# Patient Record
Sex: Female | Born: 1965
Health system: Southern US, Community
[De-identification: ages and names within clinical notes are randomized; demographics above are authoritative.]

## PROBLEM LIST (undated history)

## (undated) DIAGNOSIS — B019 Varicella without complication: Secondary | ICD-10-CM

## (undated) DIAGNOSIS — Z0000 Encounter for general adult medical examination without abnormal findings: Secondary | ICD-10-CM

## (undated) DIAGNOSIS — J019 Acute sinusitis, unspecified: Secondary | ICD-10-CM

## (undated) DIAGNOSIS — R140 Abdominal distension (gaseous): Secondary | ICD-10-CM

## (undated) DIAGNOSIS — S40269A Insect bite (nonvenomous) of unspecified shoulder, initial encounter: Secondary | ICD-10-CM

## (undated) DIAGNOSIS — H8102 Meniere's disease, left ear: Secondary | ICD-10-CM

## (undated) DIAGNOSIS — F329 Major depressive disorder, single episode, unspecified: Secondary | ICD-10-CM

## (undated) DIAGNOSIS — W57XXXA Bitten or stung by nonvenomous insect and other nonvenomous arthropods, initial encounter: Secondary | ICD-10-CM

## (undated) DIAGNOSIS — T7840XA Allergy, unspecified, initial encounter: Secondary | ICD-10-CM

## (undated) DIAGNOSIS — H6590 Unspecified nonsuppurative otitis media, unspecified ear: Secondary | ICD-10-CM

## (undated) HISTORY — DX: Acute sinusitis, unspecified: J01.90

## (undated) HISTORY — DX: Bitten or stung by nonvenomous insect and other nonvenomous arthropods, initial encounter: W57.XXXA

## (undated) HISTORY — DX: Meniere's disease, left ear: H81.02

## (undated) HISTORY — DX: Varicella without complication: B01.9

## (undated) HISTORY — DX: Major depressive disorder, single episode, unspecified: F32.9

## (undated) HISTORY — DX: Encounter for general adult medical examination without abnormal findings: Z00.00

## (undated) HISTORY — DX: Unspecified nonsuppurative otitis media, unspecified ear: H65.90

## (undated) HISTORY — DX: Abdominal distension (gaseous): R14.0

## (undated) HISTORY — DX: Allergy, unspecified, initial encounter: T78.40XA

## (undated) HISTORY — DX: Insect bite (nonvenomous) of unspecified shoulder, initial encounter: S40.269A

---
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
## 2001-07-19 ENCOUNTER — Emergency Department (HOSPITAL_COMMUNITY): Admission: EM | Admit: 2001-07-19 | Discharge: 2001-07-19 | Payer: Self-pay | Admitting: Emergency Medicine

## 2001-07-19 ENCOUNTER — Encounter: Payer: Self-pay | Admitting: Emergency Medicine

## 2002-06-21 ENCOUNTER — Emergency Department (HOSPITAL_COMMUNITY): Admission: EM | Admit: 2002-06-21 | Discharge: 2002-06-22 | Payer: Self-pay | Admitting: Emergency Medicine

## 2003-06-07 ENCOUNTER — Other Ambulatory Visit: Admission: RE | Admit: 2003-06-07 | Discharge: 2003-06-07 | Payer: Self-pay | Admitting: Obstetrics and Gynecology

## 2003-10-20 DIAGNOSIS — F32A Depression, unspecified: Secondary | ICD-10-CM

## 2003-10-20 HISTORY — DX: Depression, unspecified: F32.A

## 2003-11-27 ENCOUNTER — Inpatient Hospital Stay (HOSPITAL_COMMUNITY): Admission: AD | Admit: 2003-11-27 | Discharge: 2003-11-27 | Payer: Self-pay | Admitting: Obstetrics and Gynecology

## 2003-12-15 ENCOUNTER — Inpatient Hospital Stay (HOSPITAL_COMMUNITY): Admission: AD | Admit: 2003-12-15 | Discharge: 2003-12-20 | Payer: Self-pay | Admitting: Obstetrics and Gynecology

## 2004-11-18 ENCOUNTER — Ambulatory Visit: Payer: Self-pay | Admitting: Family Medicine

## 2004-11-20 ENCOUNTER — Emergency Department (HOSPITAL_COMMUNITY): Admission: EM | Admit: 2004-11-20 | Discharge: 2004-11-21 | Payer: Self-pay | Admitting: Emergency Medicine

## 2006-08-15 ENCOUNTER — Inpatient Hospital Stay (HOSPITAL_COMMUNITY): Admission: AD | Admit: 2006-08-15 | Discharge: 2006-08-19 | Payer: Self-pay | Admitting: Obstetrics and Gynecology

## 2006-08-16 ENCOUNTER — Encounter (INDEPENDENT_AMBULATORY_CARE_PROVIDER_SITE_OTHER): Payer: Self-pay | Admitting: *Deleted

## 2006-08-24 ENCOUNTER — Ambulatory Visit: Admission: RE | Admit: 2006-08-24 | Discharge: 2006-08-24 | Payer: Self-pay | Admitting: Obstetrics and Gynecology

## 2007-12-20 ENCOUNTER — Encounter: Admission: RE | Admit: 2007-12-20 | Discharge: 2007-12-20 | Payer: Self-pay | Admitting: Family Medicine

## 2008-01-03 ENCOUNTER — Encounter: Admission: RE | Admit: 2008-01-03 | Discharge: 2008-01-03 | Payer: Self-pay | Admitting: Family Medicine

## 2009-03-13 ENCOUNTER — Encounter: Admission: RE | Admit: 2009-03-13 | Discharge: 2009-03-13 | Payer: Self-pay | Admitting: Obstetrics and Gynecology

## 2009-04-05 IMAGING — CR DG CHEST 2V
2 series · 2 of 2 positions shown · non-contrast
Comparison: Report of a portable chest, 07/19/01.

CLINICAL DATA: Cough and fever.
 CHEST - 2 VIEWS:

[view not recorded (1 of 2)]
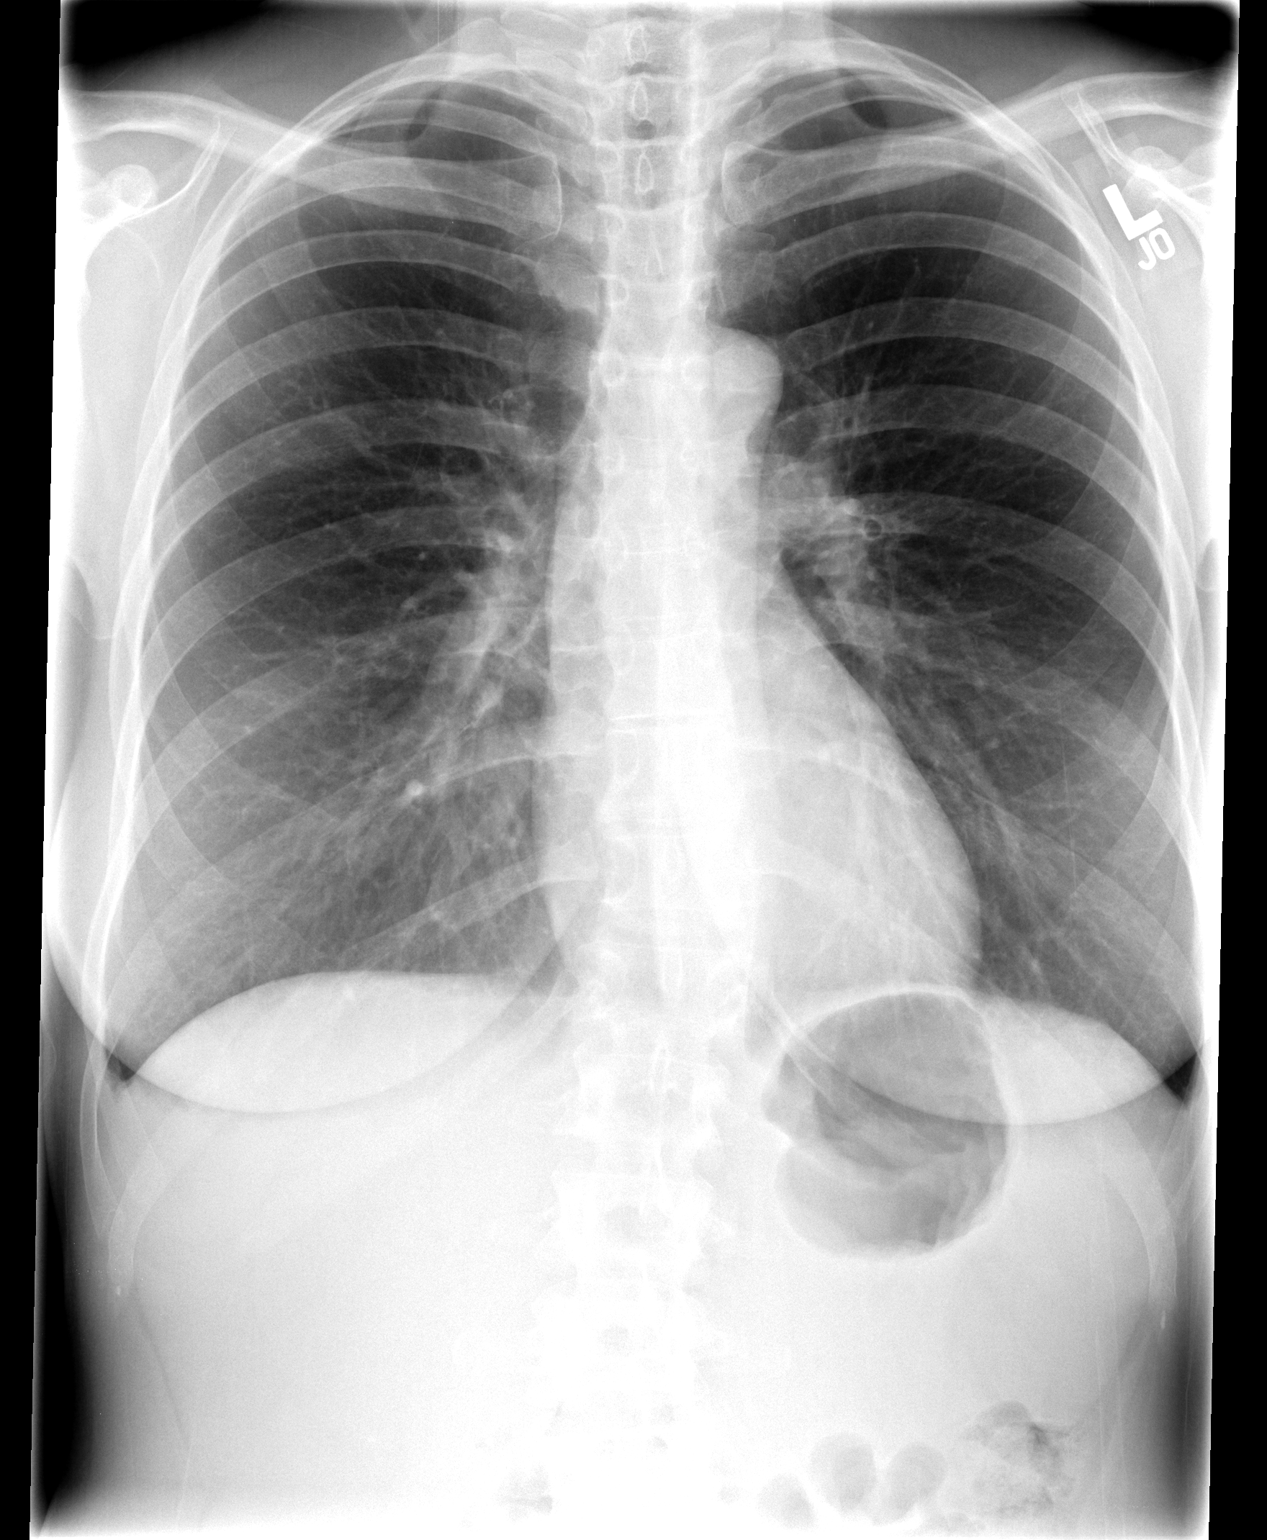

[view not recorded (2 of 2)]
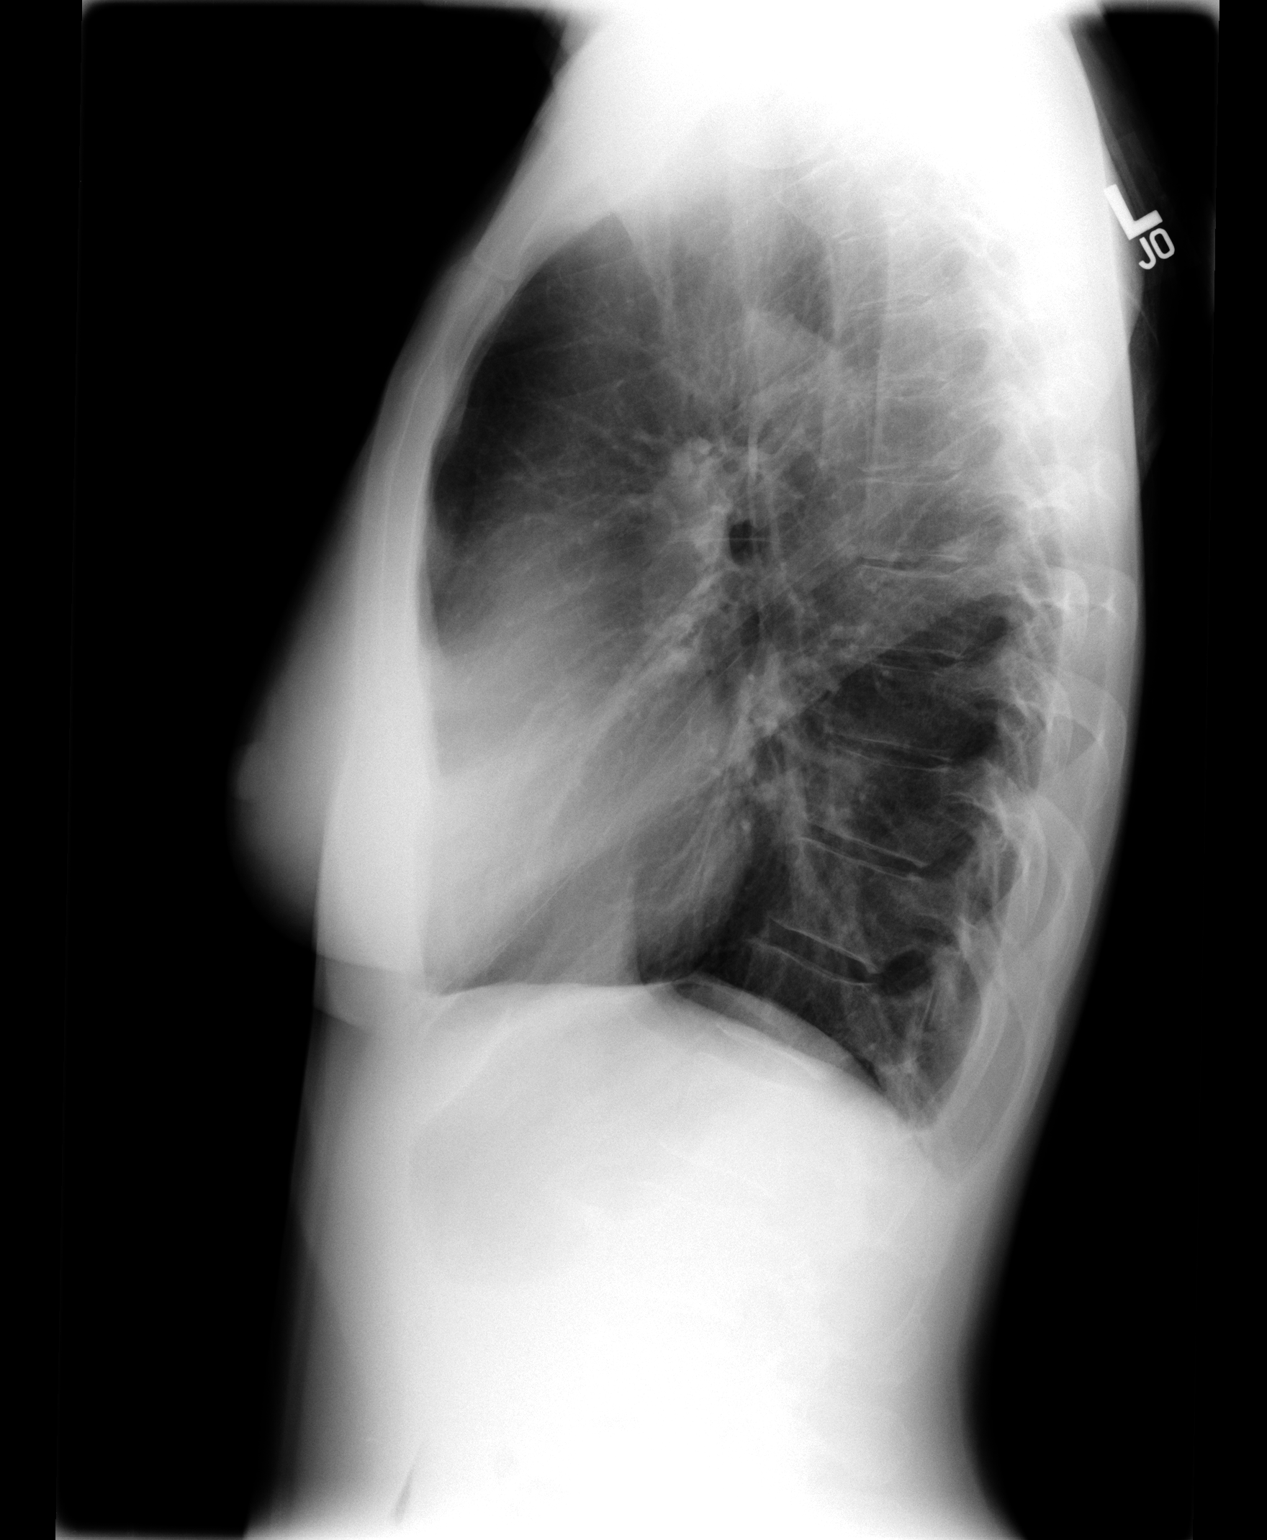

[2 of 2 positions shown; findings below may reference images not displayed]

FINDINGS: Heart and mediastinal contours are normal.  The lungs are hyperaerated.  There is peribronchial thickening without active airspace disease or pleural fluid.  Osseous structures intact.
IMPRESSION: Chronic changes ? no acute process.

## 2009-11-06 ENCOUNTER — Encounter (INDEPENDENT_AMBULATORY_CARE_PROVIDER_SITE_OTHER): Payer: Self-pay | Admitting: *Deleted

## 2009-11-11 ENCOUNTER — Telehealth: Payer: Self-pay | Admitting: Gastroenterology

## 2010-11-20 NOTE — Letter (Signed)
Summary: New Patient letter  [facility]  [address] [place], [place] [address]   Phone: [phone]  Fax: [phone]       11/06/2009 MRN: [id number]  CZARINA GINGRAS [address] [place], [place]  [address]  Dear Ms. Mordecai,  Welcome to the Gastroenterology Division at [facility].    You are scheduled to see Dr.  Christella Hartigan on 11-12-09 at 10:30AM on the 3rd floor at [facility], [address]  We ask that you try to arrive at our office 15 minutes prior to your appointment time to allow for check-in.  We would like you to complete the enclosed self-administered evaluation form prior to your visit and bring it with you on the day of your appointment.  We will review it with you.  Also, please bring a complete list of all your medications or, if you prefer, bring the medication bottles and we will list them.  Please bring your insurance card so that we may make a copy of it.  If your insurance requires a referral to see a specialist, please bring your referral form from your primary care physician.  Co-payments are due at the time of your visit and may be paid by cash, check or credit card.     Your office visit will consist of a consult with your physician (includes a physical exam), any laboratory testing he/she may order, scheduling of any necessary diagnostic testing (e.g. x-ray, ultrasound, CT-scan), and scheduling of a procedure (e.g. Endoscopy, Colonoscopy) if required.  Please allow enough time on your schedule to allow for any/all of these possibilities.    If you cannot keep your appointment, please call [phone] to cancel or reschedule prior to your appointment date.  This allows [place] the opportunity to schedule an appointment for another patient in need of care.  If you do not cancel or reschedule by 5 p.m. the business day prior to your appointment date, you will be charged a $50.00 late cancellation/no-show fee.    Thank you for choosing [facility] for your medical needs.  We appreciate the opportunity to care for you.  Please visit [place] at our website  to learn more about our practice.                     Sincerely,                                                             The Gastroenterology Division

## 2010-11-20 NOTE — Progress Notes (Signed)
Summary: appt necessary?  Phone Note Call from Patient Call back at Home Phone [phone]   Caller: Patient Call For: Dr. Christella Hartigan Reason for Call: Talk to Nurse Summary of Call: pt has appt tomorrow and wants someone to tell her whether it is still necessary that she comes... pt says her symptoms have subsidded and are almost completely resolved... told pt that this is her call, but she insisted on asking the CMA Initial call taken by: Vallarie Mare,  November 11, 2009 10:54 AM  Follow-up for Phone Call        I advised pt to call her GYN who referred her to [place] and let them know she feels better and if she should keep the appt with Dr Christella Hartigan.  Pt agreed and will call back by 5 pm today if she is going to cx the appt. Follow-up by: Chales Abrahams CMA Duncan Dull),  November 11, 2009 11:13 AM

## 2011-03-06 NOTE — Op Note (Signed)
NAME:  Nicole Lynch, Nicole Lynch NO.:  [id number]   MEDICAL RECORD NO.:  [id number]          PATIENT TYPE:  INP   LOCATION:  9130                          FACILITY:  [facility]   PHYSICIAN:  Malachi Pro. Ambrose Mantle, M.D. DATE OF BIRTH:  05-04-1966   DATE OF PROCEDURE:  08/16/2006  DATE OF DISCHARGE:                                 OPERATIVE REPORT   PREOPERATIVE DIAGNOSIS:  Intrauterine pregnancy at 40+ weeks, prior C-  section, face presentation.  Failure to progress in labor, deep variable  decelerations during Pitocin administration.   POSTOPERATIVE DIAGNOSIS:  Intrauterine pregnancy at 40+ weeks, prior C-  section, face presentation.  Failure to progress in labor, deep variable  decelerations during Pitocin administration.   OPERATION:  Low-transverse cervical C-section.   OPERATOR:  Malachi Pro. Ambrose Mantle, M.D.   ASSISTANT:  Sherron Monday, MD   ANESTHESIA:  Epidural anesthesia.   The patient was brought to the operating room.  Fetal heart tones were  confirmed to be normal.  She was placed in left lateral tilt position.  A  Foley catheter was already indwelling.  The abdomen was prepped with  Betadine solution and draped as a sterile field.  The old incision was  regular in its entirety except for the left lateral aspect the incision.  Incision dipped down inferiorly.  I asked the patient if she wanted to go  through the old scar or actually make it higher on the left and she chose to  go through the old incision.  I made the incision with the knife through the  old scar, carried in layers down through the subcutaneous tissue and fascia.  The fascia was then incised transversely, separated from the rectus muscles  superiorly and inferiorly and then the rectus muscle was already split in  the midline.  The peritoneum was opened vertically with the Mayo scissors.  Incision was enlarged.  A self-retaining flexible retractor was inserted.  The lower uterine segment was exposed.  I made  an incision into the lower  uterine segment myometrium and then went the rest of the way with my finger  encountered clear fluid.  I enlarged the incision by pulling superiorly and  inferiorly on the incision and then could tell that the baby was still in  the face presentation.  I initially tried to deliver the face through the  incisional opening but the baby did not want to come out so I converted to  an occiput anterior and then with fundal pressure the baby's head came out.  The nose and pharynx were suctioned with the bulb.  The remainder of the  baby was delivered.  The cord was clamped.  The infant was given to Dr.  Ruben Gottron who was in attendance.  He assigned the 8 pounds 11 ounces female  infant Apgars of 9 at one and 9 at five minutes.  A small amount of cord was  preserved in case pH was necessary.  I drew some blood about 5 mL for  routine cord blood studies and then removed the placenta and preserved it  for  the cord blood collection team.  There was some membrane still left in  the upper part of the uterus.  These were removed.  The inside of the uterus  was inspected, found to be free of any remaining debris.  Both tubes and  ovaries were normal as were as was the uterus.  The patient was asked again  about tubal ligation and she declined to proceed with tubal ligation.  I  closed the uterus then into layers using a running locked suture of 0 Vicryl  on the first layer, nonlocking suture of the same material on the second  layer. Hemostasis was adequate.  Liberal irrigation confirmed hemostasis.  The retractor was removed and then the rectus muscle was reapproximated in  the midline with interrupted 0 Vicryl sutures and peritoneum was included  with the rectus muscle superiorly.  The rectus fascia was then closed with  two  running sutures of 0 Vicryl.  The subcu with a running 3-0 Vicryl and the  skin was closed with automatic staples.  The patient seemed to tolerate  the  procedure well.  Blood loss was estimated at 1000 mL.  Sponge and needle  counts were correct and she was returned to recovery in satisfactory  condition.      Malachi Pro. Ambrose Mantle, M.D.  Electronically Signed     TFH/MEDQ  D:  08/16/2006  T:  08/17/2006  Job:  [id number]

## 2011-03-06 NOTE — Discharge Summary (Signed)
NAME:  HOLIDAY, MCMENAMIN NO.:  [id number]   MEDICAL RECORD NO.:  [id number]          PATIENT TYPE:  INP   LOCATION:  9130                          [facility]ACILITY:  [facility]   PHYSICIAN:  Malachi Pro. Ambrose Mantle, M.D. DATE OF BIRTH:  03-23-1966   DATE OF ADMISSION:  08/15/2006  DATE OF DISCHARGE:  08/19/2006                                 DISCHARGE SUMMARY   A 45 year old white female para 1-0-3-1, gravida 5 at 40-4/[redacted] weeks gestation  who presented to maternity admission with regular contractions.  Blood group  and type O+, negative antibody, RPR nonreactive, rubella immune, hepatitis B  surface antigen negative, GC and chlamydia negative.  One-hour Glucola 107.  Group B strep negative.  The patient had, at 6:00 a.m., spontaneous rupture  of membranes.  She was trying for vaginal birth after cesarean section and  received Pitocin to stimulate labor, but she began having deep variable  decelerations.  The Pitocin was discontinued.  The patient made very poor  progress was taken to the operating room and underwent a low transverse  cervical C-section by Dr. Ambrose Mantle for a face presentation, prior C-section  with failure to progress in labor.  Delivery was of a female infant, 8 pounds  11 ounces, Apgars of 9 at 1 and 9 at 5 minutes.  Blood loss 1000 mL.  Postpartum the patient did very well.  She passed flatus, urinating well,  tolerated a regular diet, ambulated well without difficulty and on the third  postop day her staples were removed.  Strips were applied and she was ready  for discharge.   LABORATORY DATA:  Hemoglobin 13, hematocrit 37.2, white count 10,900,  platelet count 186,000.  Follow-up hematocrit 32.  PIH panel was normal.  RPR nonreactive.   FINAL DIAGNOSES:  1. Intrauterine pregnancy at 40+ weeks.  2. Prior cesarean section.  3. Face presentation.  4. Failure to progress in labor.   OPERATION:  Low transverse cervical C-section.   FINAL CONDITION:  Improved.   Instructions include our regular discharge instruction booklet.  Percocet  5/325 24 tablets one every 4-6 hours as needed for pain.  The patient is  counseled to report any signs of postpartum depression and return to the  office in 10-14 days for follow-up examination.      Malachi Pro. Ambrose Mantle, M.D.  Electronically Signed     TFH/MEDQ  D:  08/19/2006  T:  08/19/2006  Job:  [id number]

## 2011-03-06 NOTE — H&P (Signed)
NAME:  Nicole Lynch, WARSHAWSKY NO.:  [id number]   MEDICAL RECORD NO.:  [id number]          PATIENT TYPE:  INP   LOCATION:  9174                          [facility]ILITY[facility]   PHYSICIAN:  Malachi Pro. Ambrose Mantle, M.D. DATE OF BIRTH:  [birth year]   DATE OF ADMISSION:  08/16/2006  DATE OF DISCHARGE:                                HISTORY & PHYSICAL   UNIT NUMBER:  As stated.   HISTORY OF PRESENT ILLNESS:  A 45 year old white female, para 1-0-3-1,  gravida 5, estimated gestational age 56-4/7 weeks by last period compatible  with a 9-week ultrasound with [facility] August 11, 2006.  She presented  complaining of regular contractions.  She was evaluated in Maternity  Admissions with regular contractions.  Blood group and type O positive,  negative antibody, RPR nonreactive, rubella immune, hepatitis B surface  antigen negative, GC/Chlamydia negative, one hour Glucola 107, Group B strep  negative.   PRENATAL CARE:  Advanced maternal age with normal first trimester screening  and normal ultrasound, previous low transverse cervical cesarean section  with trial of labor and desires VBAC.  The patient is on Valtrex for HSV  suppression.   OBSTETRIC HISTORY:  She did have an early abortion x2.  Spontaneous abortion  x1.  In 2005, she had a low transverse cervical cesarean section for  arrested dilatation with delivery of a 7 pound 6 ounce infant.   GYNECOLOGY HISTORY:  Herpes and Cryotherapy.   PAST MEDICAL HISTORY:  1. Postpartum depression.  2. Migraines.   PAST SURGICAL HISTORY:  1. Rhinoplasty.  2. C-section.   ALLERGIES:  No known drug allergies.   MEDICATIONS:  Valtrex 500 mg daily.   PHYSICAL EXAMINATION:  VITAL SIGNS:  Blood pressure at present is 152/85,  pulse 110, respirations 16, temperature 97.9.  HEART:  Normal size and sounds, slight tachycardia, no murmurs.  LUNGS:  Clear to auscultation.  ABDOMEN:  Soft, nontender.  Dr. Jackelyn Knife estimated the fetal weight of 7-  1/2  pounds.  Cervix is 5 cm, 90%.  The vertex has been noted to be an  effaced presentation since about 7 a.m.   HOSPITAL COURSE:  The patient requested minimal intervention.  So, Pitocin  was not begun.  At 4:35 a.m., the cervix was 3-4 cm, 90%, vertex at a -2 to  -3.  The vertex was felt to be slightly to the right by Dr. Jackelyn Knife.  At  7:20 a.m., the cervix was 5 cm by Dr. Jackelyn Knife, 90%.  Vertex was at a -1,  but some of the forehead was presenting.  He could feel the nose at 1  o'clock.  Spontaneous rupture of membranes had occurred at 6:10 a.m.  At  9:13 a.m., the contractions were fairly regular.  Fetal heart tones were  normal.  Cervix was 4 cm by my exam.  I talked to the patient about Pitocin,  and she decided that she would consider that as an intervention.  Pitocin  was begun, with the patient's consent, but the baby had variable  decelerations.  Some were quite deep, and she elected to stop  the Pitocin.  At 12:15 p.m., the cervix was 5 cm, 90%.  I could feel the nose and eyes at  a -2 station.  The patient wanted to proceed with C-section because of very  poor progress.  Probably no more than 1 cm of progress in 5 hours, deep  decelerations with the use of Pitocin and an abnormal presentation.   IMPRESSION:  Intrauterine pregnancy 40 plus weeks, prior C-section; deep  variable decelerations intermittently; failure to progress in labor.  The  patient is prepared for repeat C-section, and she is also going to decide  unequivocally whether I should tie her tubes at the time of the C-section.      Malachi Pro. Ambrose Mantle, M.D.  Electronically Signed     TFH/MEDQ  D:  08/16/2006  T:  08/16/2006  Job:  [id number]

## 2011-03-06 NOTE — Discharge Summary (Signed)
NAME:  Nicole Lynch, Nicole Lynch NO.:  [id number]   MEDICAL RECORD NO.:  [id number]                   PATIENT TYPE:  INP   LOCATION:  9103                                 [facility]ACILITY:  [facility]   PHYSICIAN:  Zenaida Niece, M.D.             DATE OF BIRTH:  [birth year]   DATE OF ADMISSION:  12/15/2003  DATE OF DISCHARGE:  12/20/2003                                 DISCHARGE SUMMARY   ADMISSION DIAGNOSES:  1. Intrauterine pregnancy at 39 weeks.  2. Gestational hypertension.   DISCHARGE DIAGNOSES:  1. Intrauterine pregnancy at 39 weeks.  2. Gestational hypertension.  3. Arrest of dilation.   PROCEDURE:  On December 16, 2003, primary low transverse Cesarean section.   HISTORY AND PHYSICAL:  This is a 45 year old white female, gravida 4, para 0-  0-3-0, with an EGA of 39+ weeks who presented with a complaint of decreased  fetal movement and contractions without bleeding or rupture of membranes.  Evaluation in triage revealed a reactive nonstress test, but elevated blood  pressures. Labs revealed low normal platelets, slightly elevated uric acid  and creatinine, and her vaginal exam was 1, 60, -2, and she was thus  admitted for induction. Prenatal care was complicated by advanced maternal  age with normal first trimester screening, normal AFP, and normal  ultrasound. 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 She had a breach presentation with a  successful version on February 8th.   PRENATAL LABORATORY:  Blood type is O positive with a negative antibody  screen, RPR nonreactive, Rubella immune. Hepatitis B surface antigen  negative. HIV negative. Gonorrhea and Chlamydia negative. One-hour Glucola  was 79.  Group B strep was negative.   PAST OBSTETRIC HISTORY:  Two elective terminations and one blighted ovum.   GYNECOLOGIC HISTORY:  Herpes simplex virus and history of cryotherapy.   PAST MEDICAL HISTORY:  History  of hypertension and migraines.   PAST SURGICAL HISTORY:  Rhinoplasty.   CURRENT MEDICATIONS:  Valtrex 500 mg a day.   PHYSICAL EXAMINATION:  She was afebrile with stable vital signs, except for  blood pressure of 140 to 160 over 80 to 90.  Fetal heart tracing was  reactive with occasional contractions. Abdomen gravid, nontender with an  estimated fetal weight of 7.5 pounds. Vaginal exam per the nurse was, 1, 60,  -2, vertex, and no herpetic lesions on my exam.   HOSPITAL COURSE:  The patient was admitted and started on Pitocin induction.  On my first exam she was 1-2, 70, -1, and amniotomy revealed clear fluid.  She progressed to 5 to 6 cm, but stayed that way for at least four hours  with adequate contractions. Options were discussed and we elected to proceed  with Cesarean section. On the morning of December 16, 2003, she had a  primary low transverse Cesarean section under spinal anesthesia. Estimated  blood loss was 800 cc.  She had normal anatomy and delivered a viable female  infant with Apgars of 9 and 9, weight of 7 pounds 6 ounces, in the ROP  position. Postoperatively she did fairly well. Pre-delivery hemoglobin was  12, post-delivery was 10.6.  She did have some labile blood pressures up to  170/100, but mostly in the 130s to 140s over 90s.  On the morning of  postoperative day #4, she was felt to be stable enough for discharge home.  Her baby did have jaundice and was on the Bili-blanket. Prior to discharge  her Prolene subcuticular suture was removed and her incision was healing  well.   DISCHARGE INSTRUCTIONS:  Regular diet, pelvic rest, no strenuous activity.  Follow up is in two weeks for an incision check. Medications are Percocet,  #20, one to two p.o. q.4-6h. p.r.n. pain and over-the-counter ibuprofen as  needed.                                               Zenaida Niece, M.D.    TDM/MEDQ  D:  12/20/2003  T:  12/20/2003  Job:  [id number]

## 2011-03-06 NOTE — Op Note (Signed)
NAME:  Nicole Lynch, HIRT NO.:  [id number]   MEDICAL RECORD NO.:  [id number]                   PATIENT TYPE:  INP   LOCATION:  9103                                 [facility]   PHYSICIAN:  Zenaida Niece, M.D.             DATE OF BIRTH:  Mar 10, 1966   DATE OF PROCEDURE:  12/16/2003  DATE OF DISCHARGE:                                 OPERATIVE REPORT   PREOPERATIVE DIAGNOSES:  1. Intrauterine pregnancy at 39 weeks.  2. Pregnancy-induced hypertension.  3. Arrest of dilation.   POSTOPERATIVE DIAGNOSES:  1. Intrauterine pregnancy at 39 weeks.  2. Pregnancy-induced hypertension.  3. Arrest of dilation.   PROCEDURE:  Primary low transverse cesarean section without extension.   SURGEON:  Zenaida Niece, M.D.   ANESTHESIA:  Spinal.   ESTIMATED BLOOD LOSS:  800 mL.   FINDINGS:  The patient had normal anatomy and delivered a viable female infant  with Apgars of 8 and 9 that weighed 7 pounds 6 ounces and was in the ROP  position.   PROCEDURE IN DETAIL:  The patient was taken to the operating room and placed  in a sitting position.  Dr. Kyung Rudd. Oddono instilled spinal anesthesia and  she was placed in the dorsal supine position with a left lateral tilt.  Her  abdomen was prepped and draped in the usual sterile fashion and a Foley  catheter inserted.  The level of her anesthesia was found to be adequate and  her abdomen was entered via standard Pfannenstiel incision.  A 4-cm  transverse incision was made in the lower uterine segment and the bladder  was pushed inferior.  Uterine incision was extended bilaterally digitally.  Fetal vertex was grasped and delivered through the incision from an ROP  position.  Mouth and nares were suctioned.  The remainder of the infant  delivered atraumatically.  The cord was doubly clamped and cut and the  infant handed to the awaiting pediatric team.  Cord bloods and a segment of  cord for gas were obtained and the  placenta delivered spontaneously.  Uterus  was cleaned with a dry lap pad and all clots and debris removed.  Uterine  incision was inspected and found to be free of extensions.  Uterine incision  was closed in 1 layer, being a running-locking layer with #1 chromic, with  adequate hemostasis.  Bleeding from the serosal edges was controlled with  electrocautery.  Both tubes and ovaries were inspected and found to be  normal.  Uterine incision was again inspected and found to be hemostatic.  Subfascial space was irrigated and made hemostatic with electrocautery.  Fascia was then closed in a running fashion, starting at both ends and  meeting in the middle with 0 Vicryl.  Subcutaneous tissue was irrigated and  made hemostatic with electrocautery.  Skin incision was closed with a  running subcuticular suture of 4-0 Prolene  followed by Steri-Strips and a  sterile bandage.  The patient tolerated the procedure well and was taken to  the recovery room in stable condition.  Counts were correct and she was  given Ancef 1 mg after cord clamp.                                               Zenaida Niece, M.D.    TDM/MEDQ  D:  12/16/2003  T:  12/16/2003  Job:  [id number]

## 2011-06-26 ENCOUNTER — Encounter: Payer: Self-pay | Admitting: Family Medicine

## 2011-06-26 ENCOUNTER — Ambulatory Visit (INDEPENDENT_AMBULATORY_CARE_PROVIDER_SITE_OTHER): Payer: BC Managed Care – PPO | Admitting: Family Medicine

## 2011-06-26 DIAGNOSIS — T7840XA Allergy, unspecified, initial encounter: Secondary | ICD-10-CM

## 2011-06-26 DIAGNOSIS — C4490 Unspecified malignant neoplasm of skin, unspecified: Secondary | ICD-10-CM

## 2011-06-26 DIAGNOSIS — J329 Chronic sinusitis, unspecified: Secondary | ICD-10-CM

## 2011-06-26 DIAGNOSIS — F3289 Other specified depressive episodes: Secondary | ICD-10-CM

## 2011-06-26 DIAGNOSIS — F329 Major depressive disorder, single episode, unspecified: Secondary | ICD-10-CM

## 2011-06-26 DIAGNOSIS — G43909 Migraine, unspecified, not intractable, without status migrainosus: Secondary | ICD-10-CM

## 2011-06-26 DIAGNOSIS — J019 Acute sinusitis, unspecified: Secondary | ICD-10-CM

## 2011-06-26 MED ORDER — GUAIFENESIN ER 600 MG PO TB12: ORAL_TABLET | ORAL | Status: DC

## 2011-06-26 MED ORDER — NAPROXEN 500 MG PO TABS: 500.0000 mg | ORAL_TABLET | Freq: Two times a day (BID) | ORAL | Status: DC | PRN

## 2011-06-26 MED ORDER — AMOXICILLIN-POT CLAVULANATE 875-125 MG PO TABS: 1.0000 | ORAL_TABLET | Freq: Two times a day (BID) | ORAL | Status: AC

## 2011-06-26 MED ORDER — RIZATRIPTAN BENZOATE 10 MG PO TBDP: 10.0000 mg | ORAL_TABLET | ORAL | Status: DC | PRN

## 2011-06-26 MED ORDER — PROMETHAZINE HCL 25 MG PO TABS: 25.0000 mg | ORAL_TABLET | Freq: Three times a day (TID) | ORAL | Status: DC | PRN

## 2011-06-26 NOTE — Patient Instructions (Signed)
Sinusitis Sinuses are air pockets within the bones of your face. The growth of bacteria within a sinus leads to infection. Infection keeps the sinuses from draining. This infection is called sinusitis. SYMPTOMS There will be different areas of pain depending on which sinuses have become infected.  The maxillary sinuses often produce pain beneath the eyes.   Frontal sinusitis may cause pain in the middle of the forehead and above the eyes.  Other problems (symptoms) include:  Toothaches.   Colored, pus-like (purulent) drainage from the nose.   Any swelling, warmth, or tenderness over the sinus areas may be signs of infection.  TREATMENT Sinusitis is most often determined by an exam and you may have x-rays taken. If x-rays have been taken, make sure you obtain your results. Or find out how you are to obtain them. Your caregiver may give you medications (antibiotics). These are medications that will help kill the infection. You may also be given a medication (decongestant) that helps to reduce sinus swelling.  HOME CARE INSTRUCTIONS  Only take over-the-counter or prescription medicines for pain, discomfort, or fever as directed by your caregiver.   Drink extra fluids. Fluids help thin the mucus so your sinuses can drain more easily.   Applying either moist heat or ice packs to the sinus areas may help relieve discomfort.   Use saline nasal sprays to help moisten your sinuses. The sprays can be found at your local drugstore.  SEEK IMMEDIATE MEDICAL CARE IF YOU DEVELOP:  High fever that is still present after two days of antibiotic treatment.   Increasing pain, severe headaches, or toothache.   Nausea, vomiting, or drowsiness.   Unusual swelling around the face or trouble seeing.  MAKE SURE YOU:   Understand these instructions.   Will watch your condition.   Will get help right away if you are not doing well or get worse.  Document Released: 10/05/2005 Document Re-Released:  09/17/2008 [facility] Patient Information 2011 [place], [place].Migraine Headache A migraine headache is an intense, throbbing pain on one or both sides of your head. The exact cause of a migraine headache is not always known. A migraine may be caused when nerves in the brain become irritated and release chemicals that cause swelling (inflammation) within blood vessels, causing pain. Many migraine sufferers have a family history of migraines. Before you get a migraine you may or may not get an aura. An aura is a group of symptoms that can predict the beginning of a migraine. An aura may include:  Visual changes such as:   Flashing lights.   Seeing bright spots or zig-zag lines.   Tunnel vision.   Feelings of numbness.   Trouble talking.   Muscle weakness.  SYMPTOMS OF A MIGRAINE  A migraine headache has one or more of the following symptoms:  Pain on one or both sides of your head.   Pain that is pulsating or throbbing in nature.   Pain that is severe enough to prevent daily activities.   Pain that is aggravated by any daily physical activity.   Nausea (feeling sick to your stomach), vomiting or both.   Pain with exposure to bright lights, loud noises or activity.   General sensitivity to bright lights or loud noises.  MIGRAINE TRIGGERS A migraine headache can be triggered by many things. Examples of triggers include:   Alcohol.   Smoking.   Stress.   It may be related to menses (female menstruation).   Aged cheeses.   Foods or drinks  that contain nitrates, glutamate, aspartame or tyramine.   Lack of sleep.   Chocolate.   Caffeine.   Hunger.   Medications such as nitroglycerine (used to treat chest pain), birth control pills, estrogen and some blood pressure medications.  DIAGNOSIS  A migraine headache is often diagnosed based on:   Your symptoms.   Physical examination.   A CT scan of your head may be ordered to see if your headaches are caused from  other medical conditions.  HOME CARE INSTRUCTIONS  Medications can help prevent migraines if they are recurrent or should they become recurrent. Your caregiver can help you with a medication or treatment program that will be helpful to you.   If you get a migraine, it may be helpful to lie down in a dark, quiet room.   It may be helpful to keep a headache diary. This may help you find a trend as to what may be triggering your headaches.  SEEK IMMEDIATE MEDICAL CARE IF:   You do not get relief from the medications given to you or you have a recurrence of pain.   You have confusion, personality changes or seizures.   You have headaches that wake you from sleep.   You have an increased frequency in your headaches.   You have a stiff neck.   You have a loss of vision.   You have muscle weakness.   You start losing your balance or have trouble walking.   You feel faint or pass out.  MAKE SURE YOU:   Understand these instructions.   Will watch your condition.   Will get help right away if you are not doing well or get worse.  Document Released: 10/05/2005 Document Re-Released: 08/02/2009 [facility] Patient Information 2011 [place], [place].   Remember Ibuprofen=Advil=Motrin, is like Naproxen=Aleve, is like Aspirin  And all are NSAIDs (Nonsteroidal antiinflammatories  If you have taken any of these the only other OTC pain med to use that day is Tylenol/Acetaminophen  For HA sinus vs tension vs migraine start Naproxen and can take Maxalt quickly if it becomes a migraine they work well together

## 2011-06-29 ENCOUNTER — Encounter: Payer: Self-pay | Admitting: Family Medicine

## 2011-06-29 DIAGNOSIS — F329 Major depressive disorder, single episode, unspecified: Secondary | ICD-10-CM | POA: Insufficient documentation

## 2011-06-29 DIAGNOSIS — C4490 Unspecified malignant neoplasm of skin, unspecified: Secondary | ICD-10-CM | POA: Insufficient documentation

## 2011-06-29 DIAGNOSIS — F418 Other specified anxiety disorders: Secondary | ICD-10-CM | POA: Insufficient documentation

## 2011-06-29 DIAGNOSIS — G43909 Migraine, unspecified, not intractable, without status migrainosus: Secondary | ICD-10-CM | POA: Insufficient documentation

## 2011-06-29 DIAGNOSIS — J019 Acute sinusitis, unspecified: Secondary | ICD-10-CM

## 2011-06-29 DIAGNOSIS — T7840XA Allergy, unspecified, initial encounter: Secondary | ICD-10-CM | POA: Insufficient documentation

## 2011-06-29 DIAGNOSIS — B019 Varicella without complication: Secondary | ICD-10-CM | POA: Insufficient documentation

## 2011-06-29 HISTORY — DX: Acute sinusitis, unspecified: J01.90

## 2011-06-29 NOTE — Assessment & Plan Note (Addendum)
Patient with a long history of intermittent migraines dating back to early 73s or late 1980s. Previously seen at [facility] headache Center and they were able to sort out some of her triggers such as dust and smoke. She has since been able to manage them and they have been very infrequent. She does note exercise is helpful and over-the-counter medications have been adequate. Over the past 3 days she's had a persistent headache that is of concern to her so she is here for evaluation. She has used Imitrex in the past with good results. She does note in August 2011 she was hit in her left parietal region by a softball she was nauseous for a week cracked tooth and has been having trouble with sleep since that time. She's had a lot of facial pressure and some left eye pressure is well. This past week she had a headache several days ago took some Tylenol helped temporarily but then over the last couple days developed increased nasal congestion. Over the last 3 days worsening headache malaise nausea. She often has a droop in her eyelid on the left when her headaches get bad and that has occurred over the last few days. She is given an rx for Naproxen, Maxalt MLt and Phenergan to use as directed to manage her HA, notify us if no improvement. She agrees to follow up after fasting labs are done

## 2011-06-29 NOTE — Progress Notes (Signed)
Nicole Lynch [id number] [id number]/29/2011      Progress Note New Patient  Subjective  Chief Complaint  Chief Complaint  Patient presents with  . Establish Care    new patient    HPI  Patient is a 45 year old Caucasian female who is in today for urgent new patient appointment. She is a long history of migraine headaches dating back over 20 years. There was a time when they were frequent and happen multiple times in a week. Back in early 90s she went to a [facility] headache Center and had some triggers such as dust and smoke identified. With avoidance of these triggers, regular exercise and to myself changes she is very infrequent headaches. She notes while in the last 7 years she said maybe to really bad headaches. Her headaches get back she has a droop noted in her left eye she has a heavy painful feeling in the left side of her face. Over the last year she's had some increase in nasal congestion and pressure over her frontal sinuses as well. Her headaches get really bad she has nausea and vomiting and notes at present she does have nausea. She has not felt well for the last 3 days. She has tried Tylenol and got partial relief only to have the symptoms return. She took some Allerest which she feels estimated nasal congestion worse. She has tried some nasal saline that has helped temporarily. Last night she took some Tylenol and got some sleep awoke still with headache this morning. She notes last year she took softball to her left parietal region while playing again. She reports having nausea for a week. Denies syncope. He cracked a tooth and ultimately had to have old. She's had more trouble sleeping since that time secondary to some tenderness when she lies on that region. She continues to see Dr. Huel Cote of GYN for her Paps and GYN care. She stopped her Dr Loma Boston of dermatology and believes she has been diagnosed with skin cancer in the past but is unsure which type.  Past Medical  History  Diagnosis Date  . Chicken pox as a child  . Migraine   . Allergy     seasonal  . 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 Sinusitis acute 06/29/2011    Past Surgical History  Procedure Date  . Cesarean section 2-05, 10-07    X 2  . Deviated septum repair 1982    No family history on file.  History   Social History  . Marital Status: Married    Spouse Name: N/A    Number of Children: N/A  . Years of Education: N/A   Occupational History  . Not on file.   Social History Main Topics  . Smoking status: Never Smoker   . Smokeless tobacco: Never Used  . Alcohol Use: Yes     very rarely  . Drug Use: No  . Sexually Active: Not on file   Other Topics Concern  . Not on file   Social History Narrative  . No narrative on file    No current outpatient prescriptions on file prior to visit.    No Known Allergies  Review of Systems  Review of Systems  Constitutional: Negative for fever, chills and malaise/fatigue.  HENT: Negative for hearing loss, nosebleeds, congestion and sore throat.   Eyes: Positive for blurred vision. Negative for double vision and discharge.  Respiratory: Negative for cough, sputum production, shortness of breath and wheezing.  Cardiovascular: Negative for chest pain, palpitations and leg swelling.  Gastrointestinal: Positive for nausea. Negative for heartburn, vomiting, abdominal pain, diarrhea, constipation and blood in stool.  Genitourinary: Negative for dysuria, urgency, frequency and hematuria.  Musculoskeletal: Negative for myalgias, back pain and falls.  Skin: Negative for rash.  Neurological: Positive for headaches. Negative for dizziness, tremors, sensory change, loss of consciousness and weakness.       Drooping eyelid on left for past 2 days, this often accompanies her migraines  Endo/Heme/Allergies: Negative for polydipsia. Does not bruise/bleed easily.  Psychiatric/Behavioral: Negative for depression and suicidal ideas. The patient  is not nervous/anxious and does not have insomnia.     Objective  BP 121/82  Pulse 77  Temp(Src) 98.1 F (36.7 C) (Oral)  Ht 5' 4.5" (1.638 m)  Wt 135 lb 12.8 oz (61.598 kg)  BMI 22.95 kg/m2  SpO2 100%  LMP 06/08/2011  Physical Exam  Physical Exam  Constitutional: She is oriented to person, place, and time and well-developed, well-nourished, and in no distress. No distress.  HENT:  Head: Normocephalic and atraumatic.  Right Ear: External ear normal.  Left Ear: External ear normal.  Nose: Nose normal.  Mouth/Throat: Oropharynx is clear and moist. No oropharyngeal exudate.  Eyes: Conjunctivae and EOM are normal. Pupils are equal, round, and reactive to light. Right eye exhibits no discharge. Left eye exhibits no discharge. No scleral icterus.  Neck: Normal range of motion. Neck supple. No thyromegaly present.  Cardiovascular: Normal rate, regular rhythm, normal heart sounds and intact distal pulses.   No murmur heard. Pulmonary/Chest: Effort normal and breath sounds normal. No respiratory distress. She has no wheezes. She has no rales.  Abdominal: Soft. Bowel sounds are normal. She exhibits no distension and no mass. There is no tenderness.  Musculoskeletal: Normal range of motion. She exhibits no edema and no tenderness.  Lymphadenopathy:    She has no cervical adenopathy.  Neurological: She is alert and oriented to person, place, and time. She has normal reflexes. She displays normal reflexes. No cranial nerve deficit. Coordination normal.       Left upper eyelid drooping slightly  Skin: Skin is warm and dry. No rash noted. She is not diaphoretic.  Psychiatric: Mood, memory and affect normal.       Assessment & Plan  Migraine Patient with a long history of intermittent migraines dating back to early 1990s or late 1980s. Previously seen at [facility] headache Center and they were able to sort out some of her triggers such as dust and smoke. She has since been able to manage  them and they have been very infrequent. She does note exercise is helpful and over-the-counter medications have been adequate. Over the past 3 days she's had a persistent headache that is of concern to her so she is here for evaluation. She has used Imitrex in the past with good results. She does note in August 2011 she was hit in her left parietal region by a softball she was nauseous for a week cracked tooth and has been having trouble with sleep since that time. She's had a lot of facial pressure and some left eye pressure is well. This past week she had a headache several days ago took some Tylenol helped temporarily but then over the last couple days developed increased nasal congestion. Over the last 3 days worsening headache malaise nausea. She often has a droop in her eyelid on the left when her headaches get bad and that has occurred over the  last few days. She is given an rx for Naproxen, Maxalt MLt and Phenergan to use as directed to manage her HA, notify us if no improvement. She agrees to follow up after fasting labs are done   Depression Presently feels this is well controlled and she offers no concerns in this regard.   Allergic state Should consider a daily OTC nonsedating antihistamine if congestion persists.  Sinusitis acute She has increasing sinus pressure and congestion most notably over the left side of her face and she believes she is developing a sinus infection, she and are family are heading out of town for a vacation and she is afraid to head out of town without an antibiotic, we will start Augmentin 875 bid and mucinex 600mg  bid and she will increase her fluid intake, report if symptoms do not improve

## 2011-06-29 NOTE — Assessment & Plan Note (Signed)
She has increasing sinus pressure and congestion most notably over the left side of her face and she believes she is developing a sinus infection, she and are family are heading out of town for a vacation and she is afraid to head out of town without an antibiotic, we will start Augmentin 875 bid and mucinex 600mg  bid and she will increase her fluid intake, report if symptoms do not improve

## 2011-06-29 NOTE — Assessment & Plan Note (Signed)
Should consider a daily OTC nonsedating antihistamine if congestion persists.

## 2011-06-29 NOTE — Assessment & Plan Note (Signed)
Presently feels this is well controlled and she offers no concerns in this regard.

## 2011-10-07 ENCOUNTER — Ambulatory Visit (INDEPENDENT_AMBULATORY_CARE_PROVIDER_SITE_OTHER): Payer: BC Managed Care – PPO | Admitting: Family Medicine

## 2011-10-07 ENCOUNTER — Telehealth: Payer: Self-pay | Admitting: Family Medicine

## 2011-10-07 ENCOUNTER — Encounter: Payer: Self-pay | Admitting: Family Medicine

## 2011-10-07 VITALS — BP 128/85 | HR 85 | Temp 98.5°F | Ht 64.5 in | Wt 137.8 lb

## 2011-10-07 DIAGNOSIS — R05 Cough: Secondary | ICD-10-CM | POA: Insufficient documentation

## 2011-10-07 MED ORDER — HYDROCODONE-HOMATROPINE 5-1.5 MG/5ML PO SYRP: ORAL_SOLUTION | ORAL | Status: DC

## 2011-10-07 MED ORDER — PREDNISONE 20 MG PO TABS: ORAL_TABLET | ORAL | Status: DC

## 2011-10-07 MED ORDER — ALBUTEROL SULFATE HFA 108 (90 BASE) MCG/ACT IN AERS: INHALATION_SPRAY | RESPIRATORY_TRACT | Status: DC

## 2011-10-07 MED ORDER — FEXOFENADINE HCL 180 MG PO TABS: 180.0000 mg | ORAL_TABLET | Freq: Every day | ORAL | Status: DC

## 2011-10-07 MED ORDER — ALBUTEROL SULFATE (2.5 MG/3ML) 0.083% IN NEBU
2.5000 mg | INHALATION_SOLUTION | RESPIRATORY_TRACT | Status: AC
  Administered 2011-10-07: 2.5 mg via RESPIRATORY_TRACT

## 2011-10-07 NOTE — Progress Notes (Signed)
OFFICE NOTE  10/07/2011  CC:  Chief Complaint  Patient presents with  . Cough    X 1 month     HPI:   Patient is a 45 y.o. Caucasian female who is here for cough. Reports she gets similar thing yearly. Describes 1 month hx of dry cough, feels like she has tickle/PND, sore throat, some nasal mucous but doesn't feel like face/sinuses/head congested. No chest tightness, no dyspnea, no wheezing, no fever.  Feels tired mainly b/c the cough prevents much restful sleep.  Talking or being active physically will make it worse.  Speaking softly and sitting still relieve it some.  Has tried tea and various OTC cough meds and nothing helps.  No tobacco smoke exposure.   Was tested for allergies in the past and only one thing was positive and it was some type of tree pollen (done by previous caregiver, Silas Sacramento, FNP) about a year ago.  She denies ever having a chest x-ray or PFTs done.  Has never seen a pulmonologist or ENT. Denies heartburn or chest pain.  No leg swelling or pain.    Pertinent PMH:  Past Medical History  Diagnosis Date  . Chicken pox as a child  . Migraine   . Allergy     seasonal  . 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 Sinusitis acute 06/29/2011   Past Surgical History  Procedure Date  . Cesarean section 2-05, 10-07    X 2  . 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 No T/A/Ds.    MEDS;   Outpatient Prescriptions Prior to Visit  Medication Sig Dispense Refill  . Multiple Vitamin (MULTIVITAMIN) tablet Take 1 tablet by mouth daily.        . OMEGA 3 1000 MG CAPS Take 1 capsule by mouth daily.        . rizatriptan (MAXALT-MLT) 10 MG disintegrating tablet Take 1 tablet (10 mg total) by mouth as needed for migraine. May repeat in 2 hours if needed  10 tablet  1  . guaiFENesin (MUCINEX) 600 MG 12 hr tablet 1 tab po bid x 14 days if take antibiotics  28 tablet    . naproxen (NAPROSYN) 500 MG tablet Take 1  tablet (500 mg total) by mouth 2 (two) times daily as needed. With food   60 tablet  2   No facility-administered medications prior to visit.    PE: Blood pressure 128/85, pulse 85, temperature 98.5 F (36.9 C), temperature source Oral, height 5' 4.5" (1.638 m), weight 137 lb 12.8 oz (62.506 kg), last menstrual period 09/17/2011, SpO2 98.00%. Gen: Alert, well appearing.  Patient is oriented to person, place, time, and situation. ENT: Ears: EACs clear, normal epithelium.  TMs with good light reflex and landmarks bilaterally.  Eyes: no injection, icteris, swelling, or exudate.  EOMI, PERRLA. Nose: no drainage or turbinate edema/swelling.  No injection or focal lesion.  Mouth: lips without lesion/swelling.  Oral mucosa pink and moist.  Dentition intact and without obvious caries or gingival swelling.  Oropharynx without erythema, exudate, or swelling.  Neck - No masses or thyromegaly or limitation in range of motion CV: RRR, no m/r/g.   LUNGS: CTA bilat, nonlabored resps, good aeration in all lung fields.  She has some post-exhalation coughing fits frequently.  No prolongation of expiratory phase. ABD: soft, NT/ND EXT: no clubbing, cyanosis, or edema.  IMPRESSION AND PLAN: Albuterol 2.5mg  neb in office today helped minimally. Cough Multifactorial etiology: some PND, some RAD component, possibly some "silent" laryngopharyngeal reflux contributing.   Will treat with allegra 180mg  qd, Prednisone 40mg  qd x 5d, then 20mg  qd x 5d, and ventolin HFA 2 puffs q6h prn. Also add dexilant 60mg  qAM--samples given today. Other ddx to consider if she's not responding to this treatment: vocal cord dysfunction or laryngeal polyp. If not improving at 1 wk f/u will do CXR and would like to check PFTs.  Right ear cerumen impaction was irrigated today successfully by CMA--EAC clear and TM normal.     FOLLOW UP:  No Follow-up on file.

## 2011-10-07 NOTE — Telephone Encounter (Signed)
Patient informed that RXs have been called in

## 2011-10-07 NOTE — Telephone Encounter (Signed)
Patient states Albuteral Allegra & Predisone were not ready at [facility], please check with pharmacy & call patient back

## 2011-10-07 NOTE — Assessment & Plan Note (Signed)
Multifactorial etiology: some PND, some RAD component, possibly some "silent" laryngopharyngeal reflux contributing.   Will treat with allegra 180mg  qd, Prednisone 40mg  qd x 5d, then 20mg  qd x 5d, and ventolin HFA 2 puffs q6h prn. Also add dexilant 60mg  qAM--samples given today. Other ddx to consider if she's not responding to this treatment: vocal cord dysfunction or laryngeal polyp. If not improving at 1 wk f/u will do CXR and would like to check PFTs.  Right ear cerumen impaction was irrigated today successfully by CMA--EAC clear and TM normal.

## 2011-10-15 ENCOUNTER — Telehealth: Payer: Self-pay | Admitting: Family Medicine

## 2011-10-15 ENCOUNTER — Ambulatory Visit (INDEPENDENT_AMBULATORY_CARE_PROVIDER_SITE_OTHER): Payer: BC Managed Care – PPO | Admitting: Family Medicine

## 2011-10-15 ENCOUNTER — Encounter: Payer: Self-pay | Admitting: Family Medicine

## 2011-10-15 VITALS — BP 132/84 | HR 79 | Temp 99.6°F | Ht 64.5 in | Wt 137.0 lb

## 2011-10-15 DIAGNOSIS — R05 Cough: Secondary | ICD-10-CM

## 2011-10-15 DIAGNOSIS — R059 Cough, unspecified: Secondary | ICD-10-CM

## 2011-10-15 MED ORDER — BENZONATATE 200 MG PO CAPS: ORAL_CAPSULE | ORAL | Status: DC

## 2011-10-15 MED ORDER — BENZONATATE 200 MG PO CAPS
ORAL_CAPSULE | ORAL | Status: DC
Start: 2011-10-15 — End: 2011-10-15

## 2011-10-15 NOTE — Progress Notes (Signed)
OFFICE NOTE  10/17/2011  CC:  Chief Complaint  Patient presents with  . Cough    not improved     HPI:   Patient is a 45 y.o. Caucasian female who is here for f/u cough. I saw her 8d ago and started low dose prednisone x 5d, gave ventolin, dexilant, and hycodan syrup and she says nothing is improved.  She stopped dexilant after about 3d for unclear reasons. If anything, she feels worse b/c she feels like the hycodan has a paradoxic effect on her--causes severe insomnia.  Ventolin doesn't even bring temporary relief. No new sx's such as fever, myalgias, or GI sx's.  Pertinent PMH:  Bronchitic illness annually Migraine HAs Nonsmoker  MEDS;   Outpatient Prescriptions Prior to Visit  Medication Sig Dispense Refill  . dexlansoprazole (DEXILANT) 60 MG capsule Take 60 mg by mouth daily.        . fexofenadine (ALLEGRA) 180 MG tablet Take 1 tablet (180 mg total) by mouth daily.  30 tablet  11  . Multiple Vitamin (MULTIVITAMIN) tablet Take 1 tablet by mouth daily.        . OMEGA 3 1000 MG CAPS Take 1 capsule by mouth daily.        . rizatriptan (MAXALT-MLT) 10 MG disintegrating tablet Take 1 tablet (10 mg total) by mouth as needed for migraine. May repeat in 2 hours if needed  10 tablet  1  . albuterol (VENTOLIN HFA) 108 (90 BASE) MCG/ACT inhaler 2 puffs q6h prn chest tightness, cough, or wheezing  1 Inhaler  0  . predniSONE (DELTASONE) 20 MG tablet 2 tabs po qd x 5d, then 1 tab po qd x 5d  15 tablet  0    PE: Blood pressure 132/84, pulse 79, temperature 99.6 F (37.6 C), temperature source Temporal, height 5' 4.5" (1.638 m), weight 137 lb (62.143 kg), last menstrual period 09/17/2011. Gen: Alert, well appearing.  Patient is oriented to person, place, time, and situation. ENT: Ears: EACs clear, normal epithelium.  TMs with good light reflex and landmarks bilaterally.  Eyes: no injection, icteris, swelling, or exudate.  EOMI, PERRLA. Nose: no drainage or turbinate edema/swelling.  No  injection or focal lesion.  Mouth: lips without lesion/swelling.  Oral mucosa pink and moist.  Dentition intact and without obvious caries or gingival swelling.  Oropharynx without erythema, exudate, or swelling.  Neck - No masses or thyromegaly or limitation in range of motion CV: RRR, no m/r/g.   LUNGS: CTA bilat, nonlabored resps, good aeration in all lung fields. EXT: no clubbing, cyanosis, or edema.   Labs: none today  IMPRESSION AND PLAN:  Cough Have been treating as asthmatic bronchitis but I would think she would have improved a little over the last 8d, even with our conservative prednisone dosing. I am more concerned about laryngeal problem (vocal cord polyp/mass/inflammation, etc).   I'll do CXR today.  If neg, she'll contact her ENT for appt asap and will call us if she needs assistance with this. I'll d/c her ventolin.  I encouraged her to restart her dexilant to make sure we do our best to treat silent reflux just in case this is the root cause, and I also rx'd tessalon pearls to use 200mg  q8h prn.     FOLLOW UP:  Return for to be determined based on test results and specialist plan.

## 2011-10-16 ENCOUNTER — Ambulatory Visit (HOSPITAL_BASED_OUTPATIENT_CLINIC_OR_DEPARTMENT_OTHER)
Admission: RE | Admit: 2011-10-16 | Discharge: 2011-10-16 | Disposition: A | Discharge status: Home / Self Care | Payer: BC Managed Care – PPO | Source: Ambulatory Visit | Attending: Family Medicine | Admitting: Family Medicine

## 2011-10-16 ENCOUNTER — Telehealth: Payer: Self-pay | Admitting: *Deleted

## 2011-10-16 DIAGNOSIS — R059 Cough, unspecified: Secondary | ICD-10-CM | POA: Insufficient documentation

## 2011-10-16 DIAGNOSIS — R05 Cough: Secondary | ICD-10-CM

## 2011-10-16 NOTE — Telephone Encounter (Signed)
Pt is having diarrhea, bloating and gas.  She would like to know if this is a side effect of any medication given yesterday.  Please advise.

## 2011-10-16 NOTE — Telephone Encounter (Signed)
I have attempted to contact this patient by phone with the following results: left message to return my call on answering machine (home).  

## 2011-10-16 NOTE — Telephone Encounter (Signed)
Pt notified to stop dexilant to see if symptoms improve.  She states she was able to get some rest using Tessalon last night.

## 2011-10-16 NOTE — Telephone Encounter (Signed)
Could be either dexilant side effect or the tessalon perles side effect. My bet is on the dexilant, so have her stop this and see how things go. thx

## 2011-10-17 NOTE — Assessment & Plan Note (Signed)
Have been treating as asthmatic bronchitis but I would think she would have improved a little over the last 8d, even with our conservative prednisone dosing. I am more concerned about laryngeal problem (vocal cord polyp/mass/inflammation, etc).   I'll do CXR today.  If neg, she'll contact her ENT for appt asap and will call us if she needs assistance with this. I'll d/c her ventolin.  I encouraged her to restart her dexilant to make sure we do our best to treat silent reflux just in case this is the root cause, and I also rx'd tessalon pearls to use 200mg  q8h prn.

## 2012-01-05 ENCOUNTER — Other Ambulatory Visit: Payer: Self-pay | Admitting: Dermatology

## 2012-03-03 ENCOUNTER — Other Ambulatory Visit: Payer: Self-pay | Admitting: Obstetrics and Gynecology

## 2012-03-03 DIAGNOSIS — N63 Unspecified lump in unspecified breast: Secondary | ICD-10-CM

## 2012-03-10 ENCOUNTER — Other Ambulatory Visit: Payer: Self-pay | Admitting: Obstetrics and Gynecology

## 2012-03-10 ENCOUNTER — Ambulatory Visit
Admission: RE | Admit: 2012-03-10 | Discharge: 2012-03-10 | Disposition: A | Discharge status: Home / Self Care | Payer: BC Managed Care – PPO | Source: Ambulatory Visit | Attending: Obstetrics and Gynecology | Admitting: Obstetrics and Gynecology

## 2012-03-10 DIAGNOSIS — N63 Unspecified lump in unspecified breast: Secondary | ICD-10-CM

## 2012-03-17 ENCOUNTER — Other Ambulatory Visit: Payer: Self-pay | Admitting: Obstetrics and Gynecology

## 2012-03-17 ENCOUNTER — Ambulatory Visit
Admission: RE | Admit: 2012-03-17 | Discharge: 2012-03-17 | Disposition: A | Discharge status: Home / Self Care | Payer: BC Managed Care – PPO | Source: Ambulatory Visit | Attending: Obstetrics and Gynecology | Admitting: Obstetrics and Gynecology

## 2012-03-17 DIAGNOSIS — N63 Unspecified lump in unspecified breast: Secondary | ICD-10-CM

## 2012-03-18 ENCOUNTER — Inpatient Hospital Stay: Admission: RE | Admit: 2012-03-18 | Payer: BC Managed Care – PPO | Source: Ambulatory Visit

## 2012-06-03 ENCOUNTER — Ambulatory Visit: Payer: BC Managed Care – PPO | Admitting: Family Medicine

## 2012-08-28 ENCOUNTER — Encounter (HOSPITAL_BASED_OUTPATIENT_CLINIC_OR_DEPARTMENT_OTHER): Payer: Self-pay | Admitting: *Deleted

## 2012-08-28 ENCOUNTER — Emergency Department (HOSPITAL_BASED_OUTPATIENT_CLINIC_OR_DEPARTMENT_OTHER)
Admission: EM | Admit: 2012-08-28 | Discharge: 2012-08-28 | Disposition: A | Discharge status: Home / Self Care | Payer: BC Managed Care – PPO | Attending: Emergency Medicine | Admitting: Emergency Medicine

## 2012-08-28 ENCOUNTER — Emergency Department (HOSPITAL_BASED_OUTPATIENT_CLINIC_OR_DEPARTMENT_OTHER): Payer: BC Managed Care – PPO

## 2012-08-28 DIAGNOSIS — F3289 Other specified depressive episodes: Secondary | ICD-10-CM | POA: Insufficient documentation

## 2012-08-28 DIAGNOSIS — F329 Major depressive disorder, single episode, unspecified: Secondary | ICD-10-CM | POA: Insufficient documentation

## 2012-08-28 DIAGNOSIS — J209 Acute bronchitis, unspecified: Secondary | ICD-10-CM

## 2012-08-28 DIAGNOSIS — Z79899 Other long term (current) drug therapy: Secondary | ICD-10-CM | POA: Insufficient documentation

## 2012-08-28 DIAGNOSIS — R05 Cough: Secondary | ICD-10-CM | POA: Insufficient documentation

## 2012-08-28 DIAGNOSIS — R059 Cough, unspecified: Secondary | ICD-10-CM | POA: Insufficient documentation

## 2012-08-28 DIAGNOSIS — Z8669 Personal history of other diseases of the nervous system and sense organs: Secondary | ICD-10-CM | POA: Insufficient documentation

## 2012-08-28 LAB — COMPREHENSIVE METABOLIC PANEL
Albumin: 3.9 g/dL (ref 3.5–5.2)
Alkaline Phosphatase: 51 U/L (ref 39–117)
BUN: 16 mg/dL (ref 6–23)
Chloride: 105 mEq/L (ref 96–112)
Creatinine, Ser: 0.9 mg/dL (ref 0.50–1.10)
GFR calc Af Amer: 86 mL/min — ABNORMAL LOW (ref >90)
Glucose, Bld: 107 mg/dL — ABNORMAL HIGH (ref 70–99)
Total Bilirubin: 0.5 mg/dL (ref 0.3–1.2)
Total Protein: 6.8 g/dL (ref 6.0–8.3)

## 2012-08-28 LAB — CBC WITH DIFFERENTIAL/PLATELET
Basophils Relative: 0 % (ref 0–1)
Eosinophils Absolute: 0.1 10*3/uL (ref 0.0–0.7)
HCT: 37.2 % (ref 36.0–46.0)
Hemoglobin: 12.7 g/dL (ref 12.0–15.0)
Lymphs Abs: 1.8 10*3/uL (ref 0.7–4.0)
MCH: 32.1 pg (ref 26.0–34.0)
MCHC: 34.1 g/dL (ref 30.0–36.0)
Monocytes Absolute: 0.4 10*3/uL (ref 0.1–1.0)
Monocytes Relative: 8 % (ref 3–12)
RBC: 3.96 MIL/uL (ref 3.87–5.11)

## 2012-08-28 LAB — D-DIMER, QUANTITATIVE: D-Dimer, Quant: <0.27 ug/mL-FEU (ref 0.00–0.48)

## 2012-08-28 LAB — TROPONIN I: Troponin I: <0.3 ng/mL (ref <0.30)

## 2012-08-28 MED ORDER — METHYLPREDNISOLONE SODIUM SUCC 125 MG IJ SOLR
125.0000 mg | Freq: Once | INTRAMUSCULAR | Status: AC
  Administered 2012-08-28: 125 mg via INTRAVENOUS
  Filled 2012-08-28: qty 2

## 2012-08-28 MED ORDER — PREDNISONE 20 MG PO TABS: 40.0000 mg | ORAL_TABLET | Freq: Every day | ORAL | Status: DC

## 2012-08-28 MED ORDER — ALBUTEROL SULFATE HFA 108 (90 BASE) MCG/ACT IN AERS: 1.0000 | INHALATION_SPRAY | RESPIRATORY_TRACT | Status: DC | PRN

## 2012-08-28 MED ORDER — IPRATROPIUM BROMIDE 0.02 % IN SOLN
0.5000 mg | Freq: Once | RESPIRATORY_TRACT | Status: AC
  Administered 2012-08-28: 0.5 mg via RESPIRATORY_TRACT
  Filled 2012-08-28: qty 2.5

## 2012-08-28 MED ORDER — ALBUTEROL SULFATE (5 MG/ML) 0.5% IN NEBU
5.0000 mg | INHALATION_SOLUTION | Freq: Once | RESPIRATORY_TRACT | Status: AC
  Administered 2012-08-28: 5 mg via RESPIRATORY_TRACT
  Filled 2012-08-28: qty 1

## 2012-08-28 NOTE — ED Provider Notes (Signed)
History     CSN: [id number]  Arrival date & time 08/28/12  4098   First MD Initiated Contact with Patient 08/28/12 0831      Chief Complaint  Patient presents with  . Chest Pain    (Consider location/radiation/quality/duration/timing/severity/associated sxs/prior treatment) HPI Pt with progressive chest tightness and difficulty taking full breath throughout night. + dry cough. No recent extended travel. No lower ext swelling or pain. No history of CAD or PE. No family history of the same. No DM, HTN. Pt does not smoke. No URI symptoms. No fever or chills. No radiation or pain.  Past Medical History  Diagnosis Date  . Chicken pox as a child  . Migraine   . Allergy     seasonal  . 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 Sinusitis acute 06/29/2011    Past Surgical History  Procedure Date  . Cesarean section 2-05, 10-07    X 2  . Deviated septum repair 1982    Family History  Problem Relation Age of Onset  . Vision loss Mother     macular degeneration  . Heart disease Father     afib on blood thinner  . Cancer Father     prostate cancer    History  Substance Use Topics  . Smoking status: Never Smoker   . Smokeless tobacco: Never Used  . Alcohol Use: Yes     Comment: very rarely    OB History    Grav Para Term Preterm Abortions TAB SAB Ect Mult Living                  Review of Systems  Constitutional: Negative for fever and chills.  HENT: Negative for neck pain.   Respiratory: Positive for cough, chest tightness and shortness of breath.   Cardiovascular: Negative for chest pain, palpitations and leg swelling.  Gastrointestinal: Negative for nausea, vomiting and abdominal pain.  Musculoskeletal: Negative for myalgias and back pain.  Skin: Negative for pallor and wound.  Neurological: Negative for dizziness, weakness, light-headedness, numbness and headaches.    Allergies  Review of patient's allergies indicates no active allergies.  Home Medications    Current Outpatient Rx  Name  Route  Sig  Dispense  Refill  . ALBUTEROL SULFATE HFA 108 (90 BASE) MCG/ACT IN AERS   Inhalation   Inhale 1-2 puffs into the lungs every 4 (four) hours as needed for wheezing or shortness of breath.   1 Inhaler   0   . BENZONATATE 200 MG PO CAPS      1 tab po tid prn cough   30 capsule   3   . DEXLANSOPRAZOLE 60 MG PO CPDR   Oral   Take 60 mg by mouth daily.           Marland Kitchen FEXOFENADINE HCL 180 MG PO TABS   Oral   Take 1 tablet (180 mg total) by mouth daily.   30 tablet   11   . ONE-DAILY MULTI VITAMINS PO TABS   Oral   Take 1 tablet by mouth daily.           . OMEGA 3 1000 MG PO CAPS   Oral   Take 1 capsule by mouth daily.           Marland Kitchen PREDNISONE 20 MG PO TABS   Oral   Take 2 tablets (40 mg total) by mouth daily.   10 tablet   0   . RIZATRIPTAN BENZOATE  10 MG PO TBDP   Oral   Take 1 tablet (10 mg total) by mouth as needed for migraine. May repeat in 2 hours if needed   10 tablet   1     BP 131/93  Pulse 77  Temp 98.2 F (36.8 C) (Oral)  Resp 18  SpO2 100%  LMP 08/05/2012  Physical Exam  Nursing note and vitals reviewed. Constitutional: She is oriented to person, place, and time. She appears well-developed and well-nourished. No distress.  HENT:  Head: Normocephalic and atraumatic.  Mouth/Throat: Oropharynx is clear and moist.  Eyes: EOM are normal. Pupils are equal, round, and reactive to light.  Neck: Normal range of motion. Neck supple.  Cardiovascular: Normal rate and regular rhythm.  Exam reveals no gallop and no friction rub.   No murmur heard. Pulmonary/Chest: Effort normal. No respiratory distress. She has wheezes (Faint wheezes mostly in L chest). She has no rales. She exhibits no tenderness.  Abdominal: Soft. Bowel sounds are normal. There is no tenderness. There is no rebound and no guarding.  Musculoskeletal: Normal range of motion. She exhibits no edema and no tenderness.       No calf swelling or  tenderness  Neurological: She is alert and oriented to person, place, and time.  Skin: Skin is warm and dry. No rash noted. No erythema.  Psychiatric: She has a normal mood and affect. Her behavior is normal.    ED Course  Procedures (including critical care time)  Labs Reviewed  CBC WITH DIFFERENTIAL - Abnormal; Notable for the following:    RDW 11.4 (*)     All other components within normal limits  COMPREHENSIVE METABOLIC PANEL - Abnormal; Notable for the following:    Glucose, Bld 107 (*)     GFR calc non Af Amer 74 (*)     GFR calc Af Amer 86 (*)     All other components within normal limits  D-DIMER, QUANTITATIVE  TROPONIN I   Dg Chest 2 View  08/28/2012  *RADIOLOGY REPORT*  Clinical Data: Chest pressure.  CHEST - 2 VIEW  Comparison: 10/16/2011  Findings: Heart size is normal.  Mediastinal shadows are normal. Lungs are clear.  No effusions.  Minimal spinal curvature.  No acute bony finding.  IMPRESSION: Normal chest except for minimal spinal curvature.   Original Report Authenticated By: Paulina Fusi, M.D.      1. Bronchitis with bronchospasm      Date: 08/28/2012  Rate: 69  Rhythm: normal sinus rhythm  QRS Axis: normal  Intervals: normal  ST/T Wave abnormalities: nonspecific T wave changes  Conduction Disutrbances:none  Narrative Interpretation:   Old EKG Reviewed: unchanged Inverted T wave in III unchanged from prev EKG 07/19/2001   MDM   Pt feeling better after nebs. Lungs clear. Workup negative for PE, CAD. Low likelihood this is the cause. I suspect pt is having bronchospasms due to bronchitis or allergies. Advised to f/u with PMD and return to ED for worsening symptoms, fever chills or any concerns       Loren Racer, MD 08/28/12 [phone]

## 2012-08-28 NOTE — ED Notes (Signed)
Patient states that she woke up last night experiencing chest pressure and felt some difficulty catching her breath, no n/v. Took four baby aaspirin at 6 am and felt better symptoms returned about 2 hrs ago

## 2012-10-25 ENCOUNTER — Ambulatory Visit (INDEPENDENT_AMBULATORY_CARE_PROVIDER_SITE_OTHER): Payer: BC Managed Care – PPO

## 2012-10-25 DIAGNOSIS — Z23 Encounter for immunization: Secondary | ICD-10-CM

## 2012-12-08 ENCOUNTER — Other Ambulatory Visit: Payer: Self-pay | Admitting: Dermatology

## 2013-01-16 IMAGING — CR DG CHEST 2V
2 series · 2 of 2 positions shown · non-contrast
Comparison: 01/03/2008

CLINICAL DATA: Cough

CHEST - 2 VIEW

[view not recorded (1 of 2)]
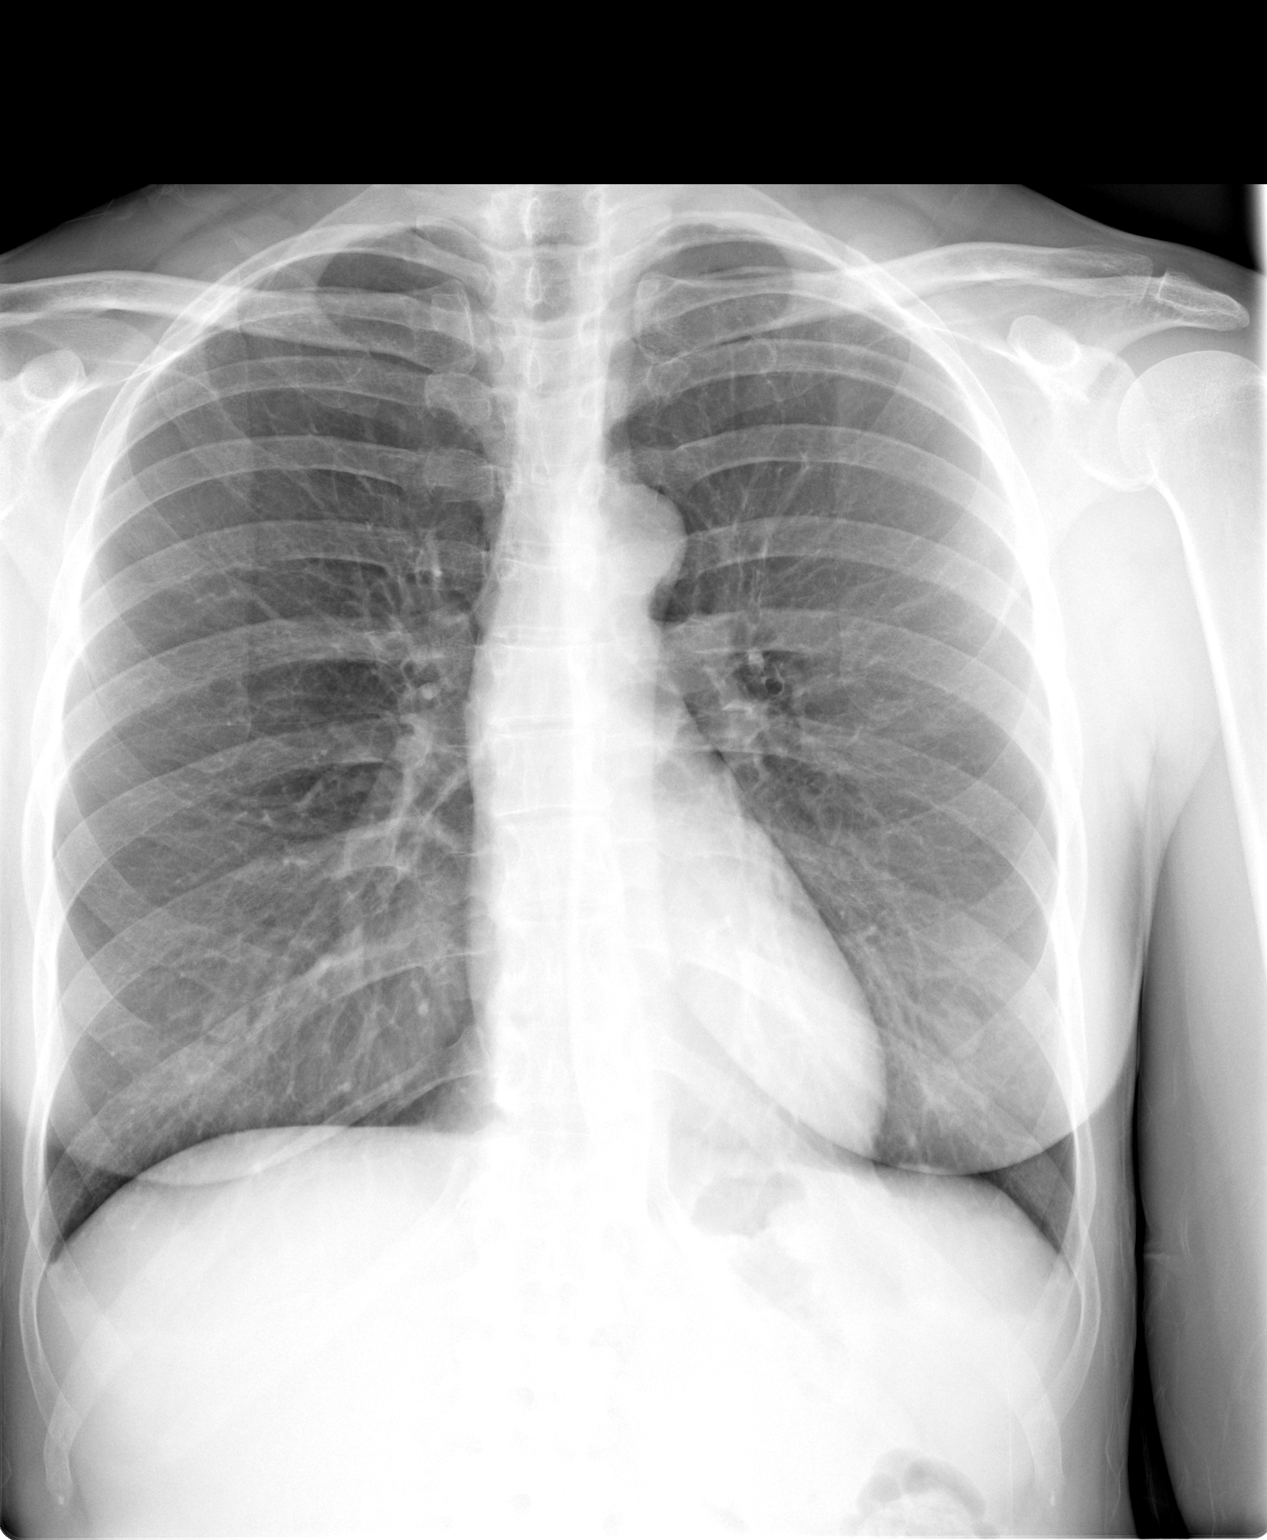

[view not recorded (2 of 2)]
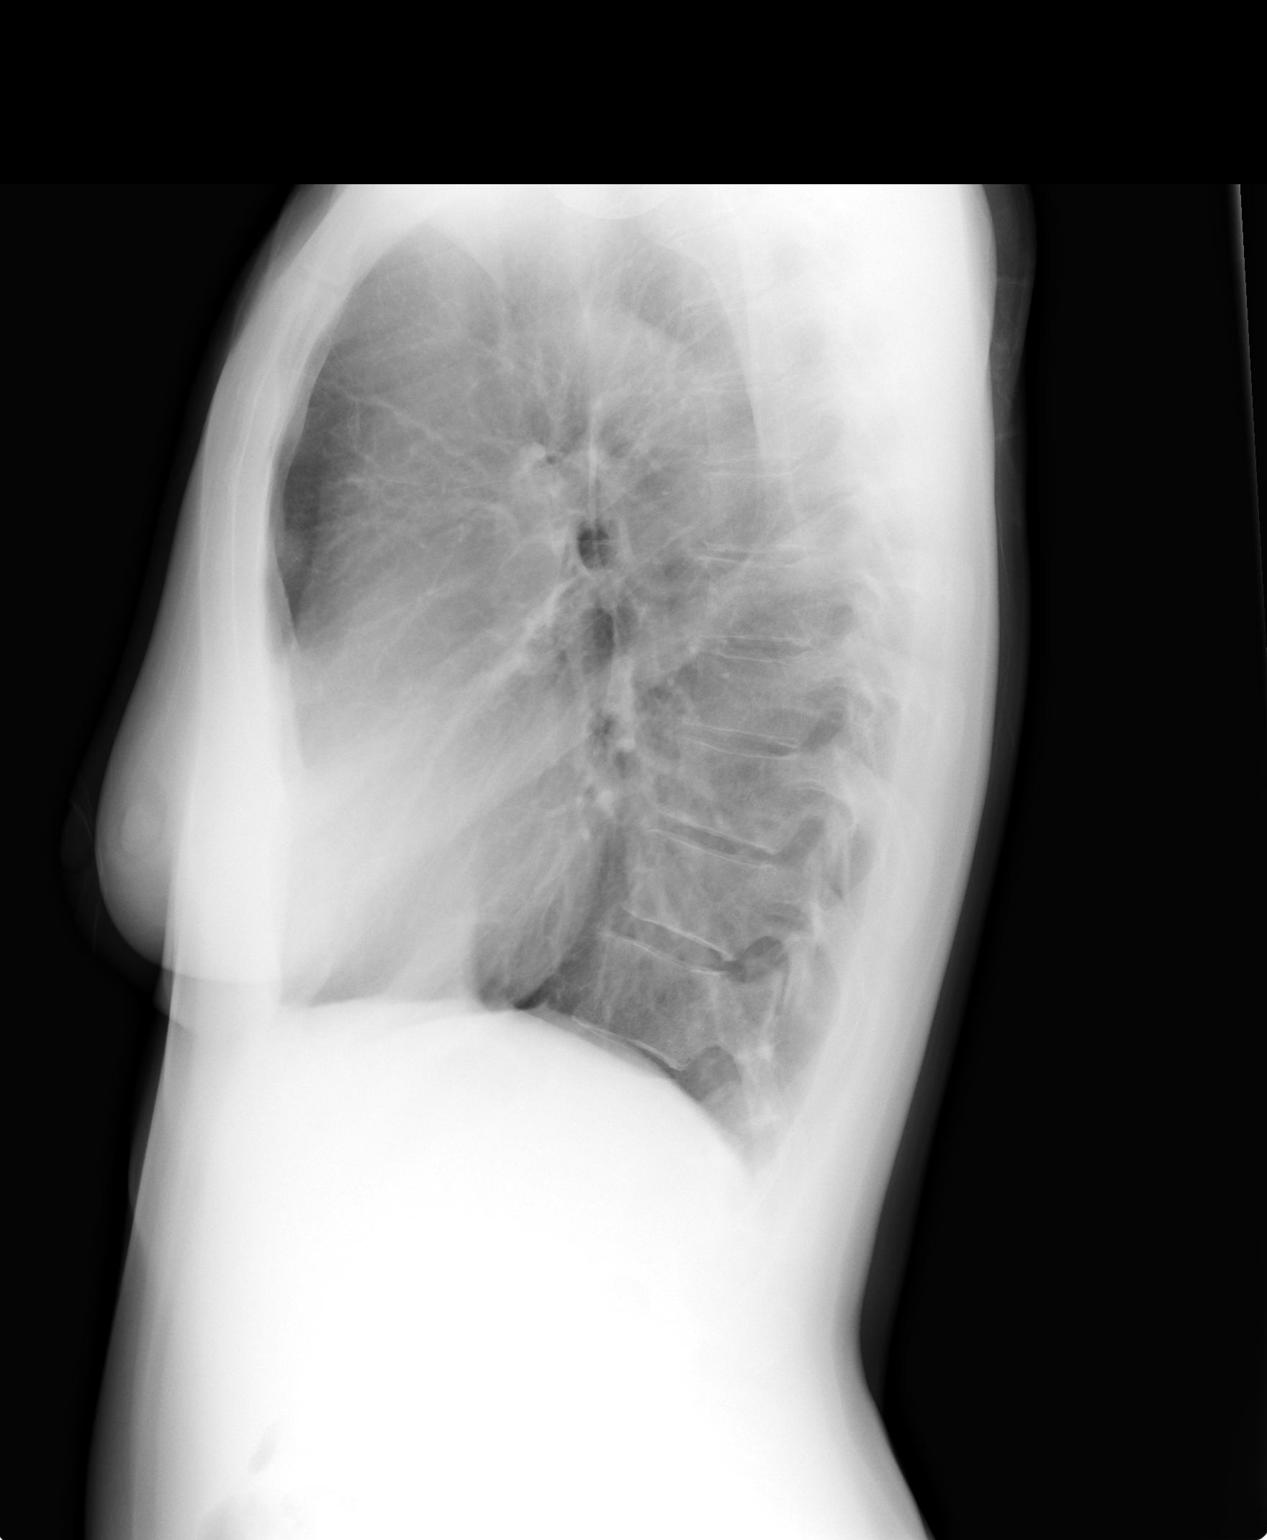

[2 of 2 positions shown; findings below may reference images not displayed]

FINDINGS: Cardiomediastinal silhouette is stable.  No acute
infiltrate
or pulmonary edema.  Central mild bronchitic changes are noted with
slight worsening from prior exam. Bony thorax is stable.
IMPRESSION: No acute infiltrate or pulmonary edema.  Slight worsening central
bronchitic changes.

## 2013-05-03 ENCOUNTER — Other Ambulatory Visit: Payer: Self-pay | Admitting: Dermatology

## 2013-08-11 NOTE — Telephone Encounter (Signed)
Opened in error

## 2013-11-07 ENCOUNTER — Telehealth: Payer: Self-pay | Admitting: Family Medicine

## 2013-11-07 NOTE — Telephone Encounter (Signed)
Patient left message on nurse voicemail stating that she needs refills of hemril 30mg  and nupercainal cream 1% sent to [facility] at [facility]. She states that this was originally prescribed by Dr. Oswaldo Milian?). Patient states that she will be going out of town this weekend and would like these refilled by then.

## 2013-11-08 MED ORDER — HYDROCORTISONE ACETATE 25 MG RE SUPP
25.0000 mg | Freq: Every day | RECTAL | Status: DC | PRN
Start: 2013-11-08 — End: 2014-10-31

## 2013-11-08 NOTE — Telephone Encounter (Signed)
Nupercainal cream 1 % apply to affected area daily, disp largest tube with 1 rf. Hemril I cannot find, please check spelling and what she uses it for, get dosing if possible, maybe I know it by another name

## 2013-11-08 NOTE — Telephone Encounter (Signed)
Hydrocortisone rectal suppository, 1 pr prn hemorrhoids, disp # 15, 5 rf

## 2013-11-08 NOTE — Telephone Encounter (Signed)
Spoke with pt. She states hemril is hydrocortisone acetate suppository 30mg . She has been using it for hemorrhoids.

## 2013-11-15 ENCOUNTER — Other Ambulatory Visit: Payer: Self-pay | Admitting: Dermatology

## 2014-01-01 ENCOUNTER — Ambulatory Visit (INDEPENDENT_AMBULATORY_CARE_PROVIDER_SITE_OTHER): Payer: 59 | Admitting: Physician Assistant

## 2014-01-01 ENCOUNTER — Other Ambulatory Visit: Payer: Self-pay | Admitting: Physician Assistant

## 2014-01-01 ENCOUNTER — Encounter: Payer: Self-pay | Admitting: Physician Assistant

## 2014-01-01 VITALS — BP 114/74 | HR 75 | Temp 98.5°F | Resp 14 | Ht 65.0 in | Wt 136.1 lb

## 2014-01-01 DIAGNOSIS — Z299 Encounter for prophylactic measures, unspecified: Secondary | ICD-10-CM

## 2014-01-01 DIAGNOSIS — Z0289 Encounter for other administrative examinations: Secondary | ICD-10-CM

## 2014-01-01 DIAGNOSIS — IMO0001 Reserved for inherently not codable concepts without codable children: Secondary | ICD-10-CM

## 2014-01-01 DIAGNOSIS — A184 Tuberculosis of skin and subcutaneous tissue: Secondary | ICD-10-CM

## 2014-01-01 DIAGNOSIS — Z021 Encounter for pre-employment examination: Secondary | ICD-10-CM

## 2014-01-01 NOTE — Patient Instructions (Signed)
Go to the lab. I will call you with your results.  Return to [place] on Wednesday for Tb skin test reading.  If you need any additional immunizations, we can give them at that time.  I will fill out your paperwork to be picked up on Wednesday as well.

## 2014-01-01 NOTE — Progress Notes (Signed)
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  Patient does not have any known significant PMH that would affect her ability to do her job.  Patient's immunization records could not be located on the [facility] or by the Health Dept.  Patient also needs PPD placement.  Past Medical History  Diagnosis Date  . Chicken pox as a child  . Migraine   . Allergy     seasonal  . 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 Sinusitis acute 06/29/2011    Current Outpatient Prescriptions on File Prior to Visit  Medication Sig Dispense Refill  . albuterol (PROVENTIL HFA;VENTOLIN HFA) 108 (90 BASE) MCG/ACT inhaler Inhale 1-2 puffs into the lungs every 4 (four) hours as needed for wheezing or shortness of breath.  1 Inhaler  0  . benzonatate (TESSALON) 200 MG capsule 1 tab po tid prn cough  30 capsule  3  . dexlansoprazole (DEXILANT) 60 MG capsule Take 60 mg by mouth daily.        . hydrocortisone (ANUSOL-HC) 25 MG suppository Place 1 suppository (25 mg total) rectally daily as needed for hemorrhoids or itching.  15 suppository  5  . Multiple Vitamin (MULTIVITAMIN) tablet Take 1 tablet by mouth daily. Takes Q3 days      . OMEGA 3 1000 MG CAPS Take 1 capsule by mouth daily.        . predniSONE (DELTASONE) 20 MG tablet Take 2 tablets (40 mg total) by mouth daily.  10 tablet  0   No current facility-administered medications on file prior to visit.    No Known Allergies  Family History  Problem Relation Age of Onset  . Vision loss Mother     macular degeneration  . Heart disease Father     afib on blood thinner  . Cancer Father     prostate cancer    History   Social History  . Marital Status: Married    Spouse Name: N/A    Number of Children: N/A  . Years of Education: N/A   Social History Main Topics  . Smoking status: Never Smoker   . Smokeless tobacco: Never Used  . Alcohol Use: Yes   Comment: very rarely  . Drug Use: No  . Sexual Activity: None   Other Topics Concern  . None   Social History Narrative  . None   Review of Systems - See HPI.  All other ROS are negative.  BP 114/74  Pulse 75  Temp(Src) 98.5 F (36.9 C) (Oral)  Resp 14  Ht 5\' 5"  (1.651 m)  Wt 136 lb 2 oz (61.746 kg)  BMI 22.65 kg/m2  SpO2 100%  LMP 12/08/2013  Physical Exam  Vitals reviewed. Constitutional: She is oriented to person, place, and time and well-developed, well-nourished, and in no distress.  HENT:  Head: Normocephalic and atraumatic.  Right Ear: External ear normal.  Left Ear: External ear normal.  Nose: Nose normal.  Mouth/Throat: Oropharynx is clear and moist. No oropharyngeal exudate.  TM within normal limits.  Eyes: Conjunctivae are normal. Pupils are equal, round, and reactive to light.  Neck: Neck supple.  Cardiovascular: Normal rate, regular rhythm, normal heart sounds and intact distal pulses.   Pulmonary/Chest: Effort normal and breath sounds normal.  Musculoskeletal: Normal range of motion.  Neurological: She is alert and oriented to person, place, and time. No cranial  nerve deficit.  Skin: Skin is warm and dry.  Psychiatric: Mood, memory, affect and judgment normal.    Recent Results (from the past 2160 hour(s))  MEASLES/MUMPS/RUBELLA IMMUNITY     Status: Abnormal   Collection Time    01/01/14 11:32 AM      Result Value Ref Range   Rubella 2.05 (*) <0.90 Index   Comment:       Reference Range:        <0.90 Index = Not Immune                         0.90-0.99 Index = Equivocal                            >=1.00 Index = Immune         Mumps IgG 136.00 (*) <9.00 AU/mL   Comment:       Reference Range:         <9.00 AU/mL = Negative                         9.00-10.99 AU/mL = Equivocal                            >=11.00 AU/mL = Positive         Rubeola IgG >300.00 (*) <25.00 AU/mL   Comment:       Reference Range:        <25.00 AU/mL = Negative                         25.00-29.99 AU/mL = Equivocal                            >=30.00 AU/mL = Positive        VARICELLA-ZOSTER BY PCR     Status: None   Collection Time    01/01/14 11:32 AM      Result Value Ref Range   Source       VZV DNA, QL PCR      HEPATITIS B SURFACE AG-PRE VC IMM ST     Status: None   Collection Time    01/01/14 11:32 AM      Result Value Ref Range   Hepatitis B Pre Screen NON REACTIVE  NON REACTIVE    Assessment/Plan: Encounter for pre-employment examination Immunization records could not be found.  Will obtain titers to assess immune status.  PPD placed.  Patient to return to clinic on Wednesday for read and for necessary immunizations.  Otherwise patient is cleared for employment. Will complete forms to give to patient upon PPD reading.

## 2014-01-01 NOTE — Progress Notes (Signed)
Pre visit review using our clinic review tool, if applicable. No additional management support is needed unless otherwise documented below in the visit note/SLS  

## 2014-01-02 ENCOUNTER — Telehealth: Payer: Self-pay

## 2014-01-02 DIAGNOSIS — Z021 Encounter for pre-employment examination: Secondary | ICD-10-CM | POA: Insufficient documentation

## 2014-01-02 LAB — HEPATITIS B SURFACE AG-PRE VC IMM ST: Hepatitis B Pre Screen: NONREACTIVE

## 2014-01-02 NOTE — Assessment & Plan Note (Signed)
Immunization records could not be found.  Will obtain titers to assess immune status.  PPD placed.  Patient to return to clinic on Wednesday for read and for necessary immunizations.  Otherwise patient is cleared for employment. Will complete forms to give to patient upon PPD reading.

## 2014-01-02 NOTE — Telephone Encounter (Signed)
Call from [facility] stating that the test for Varicella titer was ordered for a frozen sample test. Advised that we only needed Varicella titer. Selinda Flavin with [facility] will correct order and process.

## 2014-01-03 ENCOUNTER — Ambulatory Visit (INDEPENDENT_AMBULATORY_CARE_PROVIDER_SITE_OTHER): Payer: 59 | Admitting: *Deleted

## 2014-01-03 ENCOUNTER — Ambulatory Visit: Payer: 59

## 2014-01-03 DIAGNOSIS — Z23 Encounter for immunization: Secondary | ICD-10-CM

## 2014-01-03 LAB — TB SKIN TEST: TB SKIN TEST: NEGATIVE

## 2014-01-03 LAB — MEASLES/MUMPS/RUBELLA IMMUNITY
MUMPS IGG: 136 [AU]/ml — AB (ref <9.00)
Rubella: 2.05 Index — ABNORMAL HIGH (ref <0.90)

## 2014-01-03 LAB — VARICELLA ZOSTER ANTIBODY, IGG: Varicella IgG: 705.9 Index — ABNORMAL HIGH (ref <135.00)

## 2014-04-17 ENCOUNTER — Other Ambulatory Visit: Payer: Self-pay | Admitting: Dermatology

## 2014-07-24 ENCOUNTER — Ambulatory Visit (INDEPENDENT_AMBULATORY_CARE_PROVIDER_SITE_OTHER): Payer: 59 | Admitting: Family Medicine

## 2014-07-24 VITALS — BP 132/82 | HR 76 | Temp 98.2°F | Resp 16 | Ht 65.0 in | Wt 134.6 lb

## 2014-07-24 DIAGNOSIS — R110 Nausea: Secondary | ICD-10-CM

## 2014-07-24 DIAGNOSIS — G43111 Migraine with aura, intractable, with status migrainosus: Secondary | ICD-10-CM

## 2014-07-24 MED ORDER — ONDANSETRON 4 MG PO TBDP: 4.0000 mg | ORAL_TABLET | Freq: Three times a day (TID) | ORAL | Status: DC | PRN

## 2014-07-24 MED ORDER — OXYCODONE-ACETAMINOPHEN 5-325 MG PO TABS: 1.0000 | ORAL_TABLET | Freq: Three times a day (TID) | ORAL | Status: DC | PRN

## 2014-07-24 MED ORDER — PROMETHAZINE HCL 25 MG/ML IJ SOLN
25.0000 mg | Freq: Once | INTRAMUSCULAR | Status: AC
  Administered 2014-07-24: 25 mg via INTRAMUSCULAR

## 2014-07-24 MED ORDER — PROMETHAZINE HCL 25 MG PO TABS: 25.0000 mg | ORAL_TABLET | Freq: Three times a day (TID) | ORAL | Status: DC | PRN

## 2014-07-24 MED ORDER — KETOROLAC TROMETHAMINE 60 MG/2ML IM SOLN
60.0000 mg | Freq: Once | INTRAMUSCULAR | Status: AC
  Administered 2014-07-24: 60 mg via INTRAMUSCULAR

## 2014-07-24 NOTE — Progress Notes (Signed)
Chief Complaint:  Chief Complaint  Patient presents with  . Migraine    today   . Nausea    HPI: Nicole Lynch is a 48 y.o. female who is here for  An acute onset of migraine HA that  Is behind her left eye and radiating to the back of her head. She has a history of HAs starting back in the 1980s but her HAs went away after her pregnancy, she then started getting them again and was using NSAIDs with some relief and was  put on imitrex and then when she had a concussion the imitrex stopped working, she has not had one like this in some time.Her husband states that she is getting them once every other month and the HAs would incapacitate her for about 2-3 days.  She is nauseated and is unable to take anything orally, it was minor then grew all day starting at lunch and worsened by 3-4 pm . She has not taken anything since she can't keep anything down which is one of main issues. She has had applesuance but that made her feel nauseated . She has pain behind her eyes, her neck is stiff and her jaw hurts and she has left upper eyelid drooping, this used to happen to her more when she had severe HAs. This is similar to her old HA. She has a left eyelid droop when it is bad. No other neuro sxs. Has noise and light sensitivity  Denies n/w/t/CP, SOB  Prior office note from PCP mentions HA and also aura and neuro sxs and atypical presentation of HAs. Please see below.  Patient with a long history of intermittent migraines dating back to early 74s or late 1980s. Previously seen at [facility] and they were able to sort out some of her triggers such as dust and smoke. She has since been able to manage them and they have been very infrequent. She does note exercise is helpful and over-the-counter medications have been adequate. Over the past 3 days she's had a persistent headache that is of concern to her so she is here for evaluation. She has used Imitrex in the past with good results. She  does note in August 2011 she was hit in her left parietal region by a softball she was nauseous for a week cracked tooth and has been having trouble with sleep since that time. She's had a lot of facial pressure and some left eye pressure is well. This past week she had a headache several days ago took some Tylenol helped temporarily but then over the last couple days developed increased nasal congestion. Over the last 3 days worsening headache malaise nausea. She often has a droop in her eyelid on the left when her headaches get bad and that has occurred over the last few days. She is given an rx for Naproxen, Maxalt MLt and Phenergan to use as directed to manage her HA, notify us if no improvement. She agrees to follow up after fasting labs are done    Past Medical History  Diagnosis Date  . Chicken pox as a child  . Migraine   . Allergy     seasonal  . 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 Sinusitis acute 06/29/2011   Past Surgical History  Procedure Laterality Date  . Cesarean section  2-05, 10-07    X 2  . Deviated septum repair  1982   History   Social History  .  Marital Status: Married    Spouse Name: N/A    Number of Children: N/A  . Years of Education: N/A   Social History Main Topics  . Smoking status: Never Smoker   . Smokeless tobacco: Never Used  . Alcohol Use: Yes     Comment: very rarely  . Drug Use: No  . Sexual Activity: None   Other Topics Concern  . None   Social History Narrative  . None   Family History  Problem Relation Age of Onset  . Vision loss Mother     macular degeneration  . Heart disease Father     afib on blood thinner  . Cancer Father     prostate cancer   No Known Allergies Prior to Admission medications   Medication Sig Start Date End Date Taking? Authorizing Provider  hydrocortisone (ANUSOL-HC) 25 MG suppository Place 1 suppository (25 mg total) rectally daily as needed for hemorrhoids or itching. 11/08/13  Yes Mosie Lukes, MD    albuterol (PROVENTIL HFA;VENTOLIN HFA) 108 (90 BASE) MCG/ACT inhaler Inhale 1-2 puffs into the lungs every 4 (four) hours as needed for wheezing or shortness of breath. 08/28/12   Julianne Rice, MD  benzonatate (TESSALON) 200 MG capsule 1 tab po tid prn cough 10/15/11   Tammi Sou, MD  co-enzyme Q-10 30 MG capsule Take 30 mg by mouth daily.    Historical Provider, MD  dexlansoprazole (DEXILANT) 60 MG capsule Take 60 mg by mouth daily.      Historical Provider, MD  fexofenadine (ALLEGRA) 180 MG tablet Take 180 mg by mouth daily as needed. 10/07/11 01/01/14  Tammi Sou, MD  Multiple Vitamin (MULTIVITAMIN) tablet Take 1 tablet by mouth daily. Takes Q3 days    Historical Provider, MD  OMEGA 3 1000 MG CAPS Take 1 capsule by mouth daily.      Historical Provider, MD  predniSONE (DELTASONE) 20 MG tablet Take 2 tablets (40 mg total) by mouth daily. 08/28/12   Julianne Rice, MD     ROS: The patient denies fevers, chills, night sweats, unintentional weight loss, chest pain, palpitations, wheezing, dyspnea on exertion,, abdominal pain, dysuria, hematuria, melena, numbness, weakness, or tingling.  All other systems have been reviewed and were otherwise negative with the exception of those mentioned in the HPI and as above.    PHYSICAL EXAM: Filed Vitals:   07/24/14 2119  BP: 132/82  Pulse: 76  Temp: 98.2 F (36.8 C)  Resp: 16   Filed Vitals:   07/24/14 2119  Height: 5\' 5"  (1.651 m)  Weight: 134 lb 9.6 oz (61.054 kg)   Body mass index is 22.4 kg/(m^2).  General: Alert, no acute distress HEENT:  Normocephalic, atraumatic, oropharynx patent. PERRLA. + left upper eye lid droop, neg nystagmus Cardiovascular:  Regular rate and rhythm, no rubs murmurs or gallops.  No Carotid bruits, radial pulse intact. No pedal edema.  Respiratory: Clear to auscultation bilaterally.  No wheezes, rales, or rhonchi.  No cyanosis, no use of accessory musculature GI: No organomegaly, abdomen is soft and  non-tender, positive bowel sounds.  No masses. Skin: No rashes. Neurologic: Facial musculature asymmetric.Left upper eyelid droop, she has 5/5 UE and [name], follows command and is able to recall appropriately.No slurred sppech Psychiatric: Patient is appropriate throughout our interaction. Lymphatic: No cervical lymphadenopathy Musculoskeletal: Gait intact.   LABS: Results for orders placed in visit on 01/01/14  HEPATITIS B SURFACE AG-PRE VC IMM ST      Result Value Ref Range  Hepatitis B Pre Screen NON REACTIVE  NON REACTIVE     EKG/XRAY:   Primary read interpreted by Dr. Marin Comment at [facility].   ASSESSMENT/PLAN: Encounter Diagnoses  Name Primary?  . Intractable migraine with aura with status migrainosus Yes  . Nausea without vomiting    She felt a little better after IVF x 1 L , phenergan and also toradol Rx phenergan ( drowsy) , zofran ( non drowsy), advise not to use together Refer to HA clinic in [place]: [facility] ---Dr Amil Amen Rx percocet prn for severe pain, advise there is potential for rebound HAs but she was in such bad shape and really needs tobe on maintenance meds if she is having more frequent episodes Rx IM  Phenergan 25 mg xx 1 , Toradol 60 mg x 1 Precautions given to go to ER prn if start exhibiting CVA/TIA like sxs or sxxs worsen F/u  prn   Gross sideeffects, risk and benefits, and alternatives of medications d/w patient. Patient is aware that all medications have potential sideeffects and we are unable to predict every sideeffect or drug-drug interaction that may occur.  Lavita Pontius, [place], DO 07/24/2014 10:05 PM

## 2014-07-24 NOTE — Patient Instructions (Signed)

## 2014-08-31 ENCOUNTER — Encounter: Payer: Self-pay | Admitting: Medical

## 2014-08-31 ENCOUNTER — Ambulatory Visit (INDEPENDENT_AMBULATORY_CARE_PROVIDER_SITE_OTHER): Payer: 59 | Admitting: Medical

## 2014-08-31 VITALS — BP 134/88 | HR 81 | Temp 98.9°F | Ht 65.0 in | Wt 132.8 lb

## 2014-08-31 DIAGNOSIS — R002 Palpitations: Secondary | ICD-10-CM

## 2014-08-31 DIAGNOSIS — J209 Acute bronchitis, unspecified: Secondary | ICD-10-CM | POA: Insufficient documentation

## 2014-08-31 MED ORDER — HYDROCOD POLST-CHLORPHEN POLST 10-8 MG/5ML PO LQCR
5.0000 mL | Freq: Two times a day (BID) | ORAL | Status: DC | PRN
Start: 2014-08-31 — End: 2014-10-31

## 2014-08-31 MED ORDER — AZITHROMYCIN 250 MG PO TABS: ORAL_TABLET | ORAL | Status: DC

## 2014-08-31 NOTE — Assessment & Plan Note (Signed)
Pt has no palpitation or chest pain presently. Also note pt reported no leg pain. Pt ekg done today looked basically normal. Comparing today ekg and ekg in 2013, the ekg looks very similar. v1 looked same. v3 also looked same to me but artifact today. v2 also looks similar but some possible artifact.   See avs for full description. I advised to stop caffeine intake. No decongestants.

## 2014-08-31 NOTE — Progress Notes (Signed)
Subjective:    Patient ID: Nicole Lynch, female    DOB: 05/08/1966, 48 y.o.   MRN: [id number]  HPI   Pt in with cough that has been moderate to severe. Present for one week. Today she had a fever. Temp was 99.8 but she rarely gets any fever. Occasionally brings up some mucous. Husband just got over illness and had bronchitis with some pneumonia. He was on antibiotics. No obvious wheezing. No history of smoking.    Pt did mention to in past 2 months will feel rare occasional palpitation feeling. Lasts 30 seconds then stops. Approximate  1 time every 10 days. Last time was a week ago(None recently/none today. No current cardiac symptoms. Pt drinks occasional coke every 2 wks. No teas. No coffee. Pt has not used anything for cough.  Past Medical History  Diagnosis Date  . Chicken pox as a child  . Migraine   . Allergy     seasonal  . 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 Sinusitis acute 06/29/2011    History   Social History  . Marital Status: Married    Spouse Name: N/A    Number of Children: N/A  . Years of Education: N/A   Occupational History  . Not on file.   Social History Main Topics  . Smoking status: Never Smoker   . Smokeless tobacco: Never Used  . Alcohol Use: Yes     Comment: very rarely  . Drug Use: No  . Sexual Activity: Not on file   Other Topics Concern  . Not on file   Social History Narrative    Past Surgical History  Procedure Laterality Date  . Cesarean section  2-05, 10-07    X 2  . Deviated septum repair  1982    Family History  Problem Relation Age of Onset  . Vision loss Mother     macular degeneration  . Heart disease Father     afib on blood thinner  . Cancer Father     prostate cancer    No Known Allergies  Current Outpatient Prescriptions on File Prior to Visit  Medication Sig Dispense Refill  . co-enzyme Q-10 30 MG capsule Take 30 mg by mouth daily.    Marland Kitchen dexlansoprazole (DEXILANT) 60 MG capsule Take 60 mg by mouth daily.        . hydrocortisone (ANUSOL-HC) 25 MG suppository Place 1 suppository (25 mg total) rectally daily as needed for hemorrhoids or itching. 15 suppository 5  . Multiple Vitamin (MULTIVITAMIN) tablet Take 1 tablet by mouth daily. Takes Q3 days    . OMEGA 3 1000 MG CAPS Take 1 capsule by mouth daily.      Marland Kitchen oxyCODONE-acetaminophen (ROXICET) 5-325 MG per tablet Take 1-2 tablets by mouth every 8 (eight) hours as needed for severe pain. 25 tablet 0  . albuterol (PROVENTIL HFA;VENTOLIN HFA) 108 (90 BASE) MCG/ACT inhaler Inhale 1-2 puffs into the lungs every 4 (four) hours as needed for wheezing or shortness of breath. 1 Inhaler 0  . benzonatate (TESSALON) 200 MG capsule 1 tab po tid prn cough 30 capsule 3  . fexofenadine (ALLEGRA) 180 MG tablet Take 180 mg by mouth daily as needed.    . ondansetron (ZOFRAN ODT) 4 MG disintegrating tablet Take 1 tablet (4 mg total) by mouth every 8 (eight) hours as needed for nausea or vomiting. 20 tablet 0  . promethazine (PHENERGAN) 25 MG tablet Take 1 tablet (25 mg total) by mouth  every 8 (eight) hours as needed for nausea. 30 tablet 1  . promethazine (PHENERGAN) 25 MG tablet Take 1 tablet (25 mg total) by mouth every 8 (eight) hours as needed for nausea or vomiting. 20 tablet 0   No current facility-administered medications on file prior to visit.    BP 134/88 mmHg  Pulse 81  Temp(Src) 98.9 F (37.2 C) (Oral)  Ht 5\' 5"  (1.651 m)  Wt 132 lb 12.8 oz (60.238 kg)  BMI 22.10 kg/m2  SpO2 97%  LMP 08/10/2014      Review of Systems  Constitutional: Positive for fever. Negative for chills and fatigue.       Gradually feeling more chest congested over past week.  HENT: Negative for congestion, ear discharge, ear pain, nosebleeds, postnasal drip, rhinorrhea, sinus pressure, sore throat and trouble swallowing.   Respiratory: Positive for cough. Negative for chest tightness, shortness of breath and wheezing.        Productive gradually.  Cardiovascular: Negative for  chest pain and palpitations.  Gastrointestinal: Negative for nausea, vomiting, abdominal pain, diarrhea and constipation.  Genitourinary: Negative for dysuria and flank pain.  Musculoskeletal: Negative for back pain.       No leg pain reported.  Neurological: Negative for dizziness, tremors, seizures, syncope, weakness, light-headedness, numbness and headaches.  Hematological: Negative for adenopathy. Does not bruise/bleed easily.  Psychiatric/Behavioral: Negative for suicidal ideas, behavioral problems and dysphoric mood. The patient is not nervous/anxious.        Objective:   Physical Exam  Constitutional: She is oriented to person, place, and time. She appears well-developed and well-nourished. No distress.  Occasional dry intermittent cough. More at end of exam.  HENT:  Head: Normocephalic and atraumatic.  Right Ear: External ear normal.  Left Ear: External ear normal.  Nose: Nose normal.  Mouth/Throat: Oropharynx is clear and moist. No oropharyngeal exudate.  Eyes: Conjunctivae are normal. Pupils are equal, round, and reactive to light. Right eye exhibits no discharge. Left eye exhibits no discharge. No scleral icterus.  Neck: Normal range of motion. Neck supple. No JVD present. No tracheal deviation present. No thyromegaly present.  Cardiovascular: Normal rate, regular rhythm and normal heart sounds.  Exam reveals no gallop and no friction rub.   No murmur heard. Pulmonary/Chest: Effort normal and breath sounds normal. No stridor. No respiratory distress. She has no wheezes. She has no rales. She exhibits no tenderness.  Abdominal: Soft. Bowel sounds are normal. She exhibits no distension and no mass. There is no tenderness. There is no rebound and no guarding.  Lymphadenopathy:    She has no cervical adenopathy.  Neurological: She is alert and oriented to person, place, and time. No cranial nerve deficit.  Skin: She is not diaphoretic.  Psychiatric: She has a normal mood and  affect. Her behavior is normal. Judgment and thought content normal.          Assessment & Plan:

## 2014-08-31 NOTE — Assessment & Plan Note (Signed)
Pt  appears to have bronchitis and I do want to go ahead and treat you with antibiotic in light of your husband recent described severe illness. I am prescribing you azithromycin antibiotic and tussionex for cough

## 2014-08-31 NOTE — Progress Notes (Signed)
Pre visit review using our clinic review tool, if applicable. No additional management support is needed unless otherwise documented below in the visit note. 

## 2014-08-31 NOTE — Patient Instructions (Addendum)
Your appear to have bronchitis and I do want to go ahead and treat you with antibiotic in light of your husband recent described severe illness. I am prescribing you azithromycin antibiotic and tussionex for cough.   For your palpitation type sensation, I do want you to avoid any caffeine and avoid any decongestants/otc cough medications. I want you to follow up in 10-14 days or as needed. I decided since your palpitation sensation has occurred for 2 months on and off will go ahead and refer you for a holter.  If at any point sustained palpitation sensation then be seen by someone. During office hours we may be able to see you. If after hours then UC or ED.

## 2014-09-06 ENCOUNTER — Telehealth: Payer: Self-pay | Admitting: Family Medicine

## 2014-09-06 NOTE — Telephone Encounter (Signed)
Pt was seen on 11/13, edward informed her to call back if her symptoms did not get better, pt still has a really bad cough, states she is not better. Would like to know what her alternatives is.

## 2014-09-07 ENCOUNTER — Telehealth: Payer: Self-pay | Admitting: Medical

## 2014-09-07 ENCOUNTER — Encounter: Payer: Self-pay | Admitting: Medical

## 2014-09-07 MED ORDER — PREDNISONE 20 MG PO TABS: ORAL_TABLET | ORAL | Status: DC

## 2014-09-07 MED ORDER — BECLOMETHASONE DIPROPIONATE 40 MCG/ACT IN AERS: 2.0000 | INHALATION_SPRAY | Freq: Two times a day (BID) | RESPIRATORY_TRACT | Status: DC

## 2014-09-07 NOTE — Progress Notes (Signed)
[facility] called states Predisone Rx sent in incorrectly.  Re-ordered as Dose -Pak . Approved.

## 2014-09-07 NOTE — Telephone Encounter (Signed)
Sent meds to pharmacy  

## 2014-09-07 NOTE — Telephone Encounter (Signed)
I called pt today. She has persisting dry cough that is worse now. Worse with activity and worse when she lies down. No chest pain, no wheezing, and no pain behind legs. Pt on review does have history of using inhalers some in past with illnesses. She is very busy today with work and can't come in. I will call her in 3 days course prednisone, qvar and she has some albuterol inhaler at home. If this does not help then UC over the weekend. Even if she gets some better I asked her to schedule appointment with me on Monday and would ausculatate and get cxr. Pt agreed with the plan.

## 2014-09-18 ENCOUNTER — Institutional Professional Consult (permissible substitution): Payer: 59 | Admitting: Cardiovascular Disease

## 2014-10-31 ENCOUNTER — Ambulatory Visit (INDEPENDENT_AMBULATORY_CARE_PROVIDER_SITE_OTHER): Payer: 59 | Admitting: Cardiovascular Disease

## 2014-10-31 ENCOUNTER — Encounter: Payer: Self-pay | Admitting: Cardiovascular Disease

## 2014-10-31 VITALS — BP 116/90 | HR 68 | Ht 65.0 in | Wt 135.0 lb

## 2014-10-31 DIAGNOSIS — R002 Palpitations: Secondary | ICD-10-CM

## 2014-10-31 NOTE — Patient Instructions (Addendum)
Increase your intake of V-8 juice   Your physician recommends that you continue on your current medications as directed. Please refer to the Current Medication list given to you today.  Your physician recommends that you schedule a follow-up appointment in: as needed with Dr. Acie Fredrickson

## 2014-10-31 NOTE — Progress Notes (Signed)
Cardiology Office Note   Date:  10/31/2014   ID:  Nicole Lynch, DOB 08/19/1966, MRN [id number]  PCP:  Penni Homans, MD  Cardiologist:  Sukari Grist   Problem List  1. Palpitations   Chief Complaint  Patient presents with  . Palpitations     History of Present Illness: Nicole Lynch is a 49 y.o. female who presents today for cardiology evaluation.    She has been having fluttering in her chest   They last 20 seconds or so.  Have been going on for months. Has cut back / stopped drinking any drinks with caffiene.   Not associated with any other activity -  Does not cause any shortness of breath or CP.  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    [employer].      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   Lots of stressors recently.    Has 2 young children at home 10 and 8  Is active at school but no regular exercise   Today, she denies symptoms of  , chest pain, shortness of breath, orthopnea, PND, lower extremity edema, claudication, dizziness, presyncope, syncope, bleeding, or neurologic sequela. The patient is tolerating medications without difficulties and is otherwise without complaint today.   Past Medical History  Diagnosis Date  . Chicken pox as a child  . Migraine   . Allergy     seasonal  . 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 Sinusitis acute 06/29/2011    Current Outpatient Prescriptions  Medication Sig Dispense Refill  . albuterol (PROVENTIL HFA;VENTOLIN HFA) 108 (90 BASE) MCG/ACT inhaler Inhale 2 puffs into the lungs every 6 (six) hours as needed for wheezing or shortness of breath.    . co-enzyme Q-10 30 MG capsule Take 30 mg by mouth daily.    . Misc Natural Products (GLUCOSAMINE CHONDROITIN MSM PO) Take by mouth daily.    . Multiple Vitamin (MULTIVITAMIN) tablet Take 1 tablet by mouth daily. Takes Q3 days    . OMEGA 3 1000 MG CAPS Take 1 capsule by mouth daily. KRILL OIL      No current facility-administered medications for this visit.    Allergies:   Review of patient's allergies indicates no known allergies.   Social History:  The patient  reports that she has never smoked. She has never used smokeless tobacco. She reports that she drinks alcohol. She reports that she does not use illicit drugs.   Family History:  The patient's family history includes Cancer in her father; Heart disease in her father; Vision loss in her mother.    ROS:  Please see the history of present illness.    All other systems are reviewed and negative.    PHYSICAL EXAM: VS:  BP 116/90 mmHg  Pulse 68  Ht 5\' 5"  (1.651 m)  Wt 135 lb (61.236 kg)  BMI 22.47 kg/m2  SpO2 99% , BMI Body mass index is 22.47 kg/(m^2). GEN: Well nourished, well developed, in no acute distress HEENT: normal Neck: no JVD, carotid bruits, or masses Cardiac: RRR; no murmurs, rubs, or gallops,no edema  Respiratory:  clear to auscultation bilaterally, normal work of breathing GI: soft, nontender, nondistended, + BS MS: no deformity or atrophy Skin: warm and dry, no rash Neuro:  Strength and sensation are intact Psych: euthymic mood, full affect  EKG:  EKG is not ordered today.  Recent Labs: No results found for requested labs within last 365 days.  No results found for requested labs within last 365 days.     CrCl cannot be calculated (Patient has no serum creatinine result on file.).   Wt Readings from Last 3 Encounters:  10/31/14 135 lb (61.236 kg)  08/31/14 132 lb 12.8 oz (60.238 kg)  07/24/14 134 lb 9.6 oz (61.054 kg)     Other studies [facility]  ASSESSMENT AND PLAN:  1.  Premature Ventricular contractions:  She has occasional palpitation that clinically sound like PVCs. Have recommended a V-8 juice each day. She will call if she needs addition studies.  Discussed heart monitor. Also discussed the [email or web address] monitor which is an smart phone telemetry monitor.  2. Migraine  Headaches:  Seem to be relived with caffiene but she will try to limit them to help with the PVC.      Current medicines are reviewed at length with the patient today.  The patient is with any concerns regarding medicines and no changes are made today.  Disposition:   FU with me as needed.   Thayer Headings, Brooke Bonito., MD, [facility] 10/31/2014, 4:35 PM [address]  [place] Pager [phone]      [place] [address] [place] [address]  [phone] (office) [phone] (fax)

## 2015-04-02 LAB — BASIC METABOLIC PANEL
GLUCOSE: 74 mg/dL
POTASSIUM: 4.3 mmol/L (ref 3.4–5.3)

## 2015-04-02 LAB — LIPID PANEL
Cholesterol: 118 mg/dL (ref 0–200)
HDL: 52 mg/dL (ref 35–70)
TRIGLYCERIDES: 63 mg/dL (ref 40–160)

## 2015-04-02 LAB — CBC AND DIFFERENTIAL
HCT: 40 % (ref 36–46)
Hemoglobin: 13.6 g/dL (ref 12.0–16.0)
PLATELETS: 194 10*3/uL (ref 150–399)
WBC: 5.6 10*3/mL

## 2015-04-02 LAB — HEPATIC FUNCTION PANEL
ALT: 7 U/L (ref 7–35)
AST: 14 U/L (ref 13–35)

## 2015-04-02 LAB — HM MAMMOGRAPHY

## 2015-04-25 ENCOUNTER — Ambulatory Visit (INDEPENDENT_AMBULATORY_CARE_PROVIDER_SITE_OTHER): Payer: Commercial Managed Care - HMO | Admitting: Internal Medicine

## 2015-04-25 ENCOUNTER — Encounter: Payer: Self-pay | Admitting: Internal Medicine

## 2015-04-25 VITALS — BP 110/72 | HR 67 | Temp 98.2°F | Ht 65.0 in | Wt 132.5 lb

## 2015-04-25 DIAGNOSIS — G43009 Migraine without aura, not intractable, without status migrainosus: Secondary | ICD-10-CM | POA: Diagnosis not present

## 2015-04-25 DIAGNOSIS — R51 Headache: Secondary | ICD-10-CM

## 2015-04-25 DIAGNOSIS — R519 Headache, unspecified: Secondary | ICD-10-CM

## 2015-04-25 LAB — POCT URINE PREGNANCY: PREG TEST UR: NEGATIVE

## 2015-04-25 MED ORDER — PREDNISONE 10 MG PO TABS: ORAL_TABLET | ORAL | Status: DC

## 2015-04-25 MED ORDER — ONDANSETRON HCL 4 MG PO TABS: 4.0000 mg | ORAL_TABLET | Freq: Three times a day (TID) | ORAL | Status: DC | PRN

## 2015-04-25 MED ORDER — SUMATRIPTAN SUCCINATE 100 MG PO TABS: 100.0000 mg | ORAL_TABLET | ORAL | Status: DC | PRN

## 2015-04-25 MED ORDER — KETOROLAC TROMETHAMINE 60 MG/2ML IM SOLN
60.0000 mg | Freq: Once | INTRAMUSCULAR | Status: AC
  Administered 2015-04-25: 60 mg via INTRAMUSCULAR

## 2015-04-25 NOTE — Progress Notes (Signed)
Pre visit review using our clinic review tool, if applicable. No additional management support is needed unless otherwise documented below in the visit note. 

## 2015-04-25 NOTE — Progress Notes (Signed)
Subjective:    Patient ID: Nicole Lynch, female    DOB: [birth year], 49 y.o.   MRN: [id number]  DOS:  04/25/2015 Type of visit - description : Acute Interval history: Symptoms started 6 days ago after she worked in her backyard and she felt overheated and dehydrated. On and off left sided headache associated with eye drooping (feature that has been present in previous migraines), nausea. She took Excedrin- Advil with partial response on and off. + photo and phonophobia Symptoms decrease with rest in a dark room.  She used to have migraines up to agel 21, at age 56 she had a head concussion and developed different HAs after that.  After the concussions the headache was consistently at the site of the impact on the left side of the head, had extensive workup including MRIs and ENT visits. A year ago had a cranial sacral therapy which help. Since then the headache are infrequent. This particular episode has features of both regular migraine headache and  postconcussion headaches but is going on for 6 days which is concerning to the patient.    Review of Systems No fever chills No eye tearing, no aura No sinus pain or congestion. No recent head injury Some dizziness, no diplopia or slurred speech. Her periods are very regular, she is sexually active on no birth control.  Past Medical History  Diagnosis Date  . Chicken pox as a child  . Migraine   . Allergy     seasonal  . 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 Sinusitis acute 06/29/2011    Past Surgical History  Procedure Laterality Date  . Cesarean section  2-05, 10-07    X 2  . Deviated septum repair  1982    History   Social History  . Marital Status: Married    Spouse Name: N/A  . Number of Children: 2  . Years of Education: N/A   Occupational History  . Conservation officer, nature     Social History Main Topics  . Smoking status: Never Smoker   . Smokeless tobacco: Never Used  . Alcohol Use: No     Comment:    . Drug Use: No    . Sexual Activity: Not on file   Other Topics Concern  . Not on file   Social History Narrative        Medication List       This list is accurate as of: 04/25/15  5:41 PM.  Always use your most recent med list.               albuterol 108 (90 BASE) MCG/ACT inhaler  Commonly known as:  PROVENTIL HFA;VENTOLIN HFA  Inhale 2 puffs into the lungs every 6 (six) hours as needed for wheezing or shortness of breath.     co-enzyme Q-10 30 MG capsule  Take 30 mg by mouth daily.     GLUCOSAMINE CHONDROITIN MSM PO  Take by mouth daily.     multivitamin tablet  Take 1 tablet by mouth daily. Takes Q3 days     Omega 3 1000 MG Caps  Take 1 capsule by mouth daily. KRILL OIL     ondansetron 4 MG tablet  Commonly known as:  ZOFRAN  Take 1 tablet (4 mg total) by mouth every 8 (eight) hours as needed for nausea or vomiting.     predniSONE 10 MG tablet  Commonly known as:  DELTASONE  4 tablets x 2 days, 3 tabs  x 2 days, 2 tabs x 2 days, 1 tab x 2 days     SUMAtriptan 100 MG tablet  Commonly known as:  IMITREX  Take 1 tablet (100 mg total) by mouth every 2 (two) hours as needed for migraine. May repeat in 2 hours if headache persists or recurs.           Objective:   Physical Exam BP 110/72 mmHg  Pulse 67  Temp(Src) 98.2 F (36.8 C) (Oral)  Ht 5\' 5"  (1.651 m)  Wt 132 lb 8 oz (60.102 kg)  BMI 22.05 kg/m2  SpO2 98%  LMP 04/08/2015 (Approximate)  General:   Well developed, well nourished, patient is resting at the examining table, the room is dark.  HEENT:  Normocephalic . No tearing, nose not congested, sinuses not TTP. Lungs:  CTA B Normal respiratory effort, no intercostal retractions, no accessory muscle use. Heart: RRR,  no murmur.  No pretibial edema bilaterally  Skin: Not pale. Not jaundice. No facial rash Neurologic:  alert & oriented X3.  DTRs symmetric Motor exam: Transient left eye ptosis observed. EOMI, pupils equal and reactive Neck is full range of  motion Speech normal, gait appropriate for age and unassisted Psych--  Cognition and judgment appear intact.  Cooperative with normal attention span and concentration.  Behavior appropriate. No anxious or depressed appearing.       Assessment & Plan:    Migraine headache Symptoms consistent with a migraine headache. UPT negative Plan: Toradol, prednisone, Phenergan, Imitrex (reports good response to Imitrex in the remote past) Prompt ER eval if not improving in the next 24 hours or if the symptoms become different. Follow-up 10 days

## 2015-04-25 NOTE — Patient Instructions (Addendum)
Rest  Drink plenty of fluids  Start prednisone  Imitrex: 1 tablet, may repeat 2 hours later if the headaches continue. Do not take more than 2 tablets in a 24-hour period  Zofran as needed for headache  Come back in 10 days  ER or call if you are not improving in the next 24 hours  Also ER if you if the headache is different, you have a rash, fever chills, neck stiffness or the headache is severe.

## 2015-04-26 ENCOUNTER — Telehealth: Payer: Self-pay

## 2015-04-26 NOTE — Telephone Encounter (Signed)
-----   Message from Wilfrid Lund, [place] sent at 04/26/2015 10:02 AM EDT ----- Regarding: RE: check on the patient Spoke with Pt, she is feeling [facility] better, still having some pain but not nearly as bad. She has been able to eat and keep down a solid breakfast, she is getting ready to take her first Prednisone for today, she did take Prednisone last night with the Imitrex. She is going out today and will be taking the Imitrex with her. She also wanted to mention that she wasn't able to get her medication until around 8 PM last night. She was not thinking about having to drive all the way to [place] to pick it up. She wanted to mention maybe next time she will use our pharmacy downstairs. I informed her that I was very glad that she was feeling better. Informed her to call us if she needs refill on Imitrex or if severe headaches resurface. Pt verbalized understanding and wanted to thank [place] for our help.   ----- Message -----    From: Colon Branch, MD    Sent: 04/25/2015   5:44 PM      To: Colon Branch, MD, Wilfrid Lund, CMA Subject: check on the patient                           please check on the patient, was seen today w/a severe headache. Let me know how she's doing

## 2015-05-06 ENCOUNTER — Ambulatory Visit (INDEPENDENT_AMBULATORY_CARE_PROVIDER_SITE_OTHER): Payer: BLUE CROSS/BLUE SHIELD | Admitting: Internal Medicine

## 2015-05-06 ENCOUNTER — Encounter: Payer: Self-pay | Admitting: Internal Medicine

## 2015-05-06 VITALS — BP 118/76 | HR 65 | Temp 98.0°F | Ht 65.0 in | Wt 132.0 lb

## 2015-05-06 DIAGNOSIS — G43009 Migraine without aura, not intractable, without status migrainosus: Secondary | ICD-10-CM

## 2015-05-06 DIAGNOSIS — R51 Headache: Secondary | ICD-10-CM

## 2015-05-06 DIAGNOSIS — R519 Headache, unspecified: Secondary | ICD-10-CM

## 2015-05-06 NOTE — Progress Notes (Signed)
Subjective:    Patient ID: Nicole Lynch, female    DOB: 28-Dec-1965, 49 y.o.   MRN: [id number]  DOS:  05/06/2015 Type of visit - description :  Follow-up Interval history: since her last office visit she is doing better. After they toradol shot HA didn't decrease, is only after Imitrex that she started feeling better. although improved, she has been experiencing daily headaches mostly early in the morning. 2 days ago HA was intense enough that she decided to take Imitrex which worked within 3-4 hours. Yesterday for the first time she woke up without a headache.   Review of Systems denies nausea or voting. When asked, admits to some neck soreness  Past Medical History  Diagnosis Date  . Chicken pox as a child  . Migraine   . Allergy     seasonal  . 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 Sinusitis acute 06/29/2011    Past Surgical History  Procedure Laterality Date  . Cesarean section  2-05, 10-07    X 2  . Deviated septum repair  1982    History   Social History  . Marital Status: Married    Spouse Name: N/A  . Number of Children: 2  . Years of Education: N/A   Occupational History  . Conservation officer, nature     Social History Main Topics  . Smoking status: Never Smoker   . Smokeless tobacco: Never Used  . Alcohol Use: No     Comment:    . Drug Use: No  . Sexual Activity: Not on file   Other Topics Concern  . Not on file   Social History Narrative        Medication List       This list is accurate as of: 05/06/15 10:48 AM.  Always use your most recent med list.               albuterol 108 (90 BASE) MCG/ACT inhaler  Commonly known as:  PROVENTIL HFA;VENTOLIN HFA  Inhale 2 puffs into the lungs every 6 (six) hours as needed for wheezing or shortness of breath.     co-enzyme Q-10 30 MG capsule  Take 30 mg by mouth daily.     GLUCOSAMINE CHONDROITIN MSM PO  Take by mouth daily.     multivitamin tablet  Take 1 tablet by mouth daily. Takes Q3 days     Omega 3  1000 MG Caps  Take 1 capsule by mouth daily. KRILL OIL     ondansetron 4 MG tablet  Commonly known as:  ZOFRAN  Take 1 tablet (4 mg total) by mouth every 8 (eight) hours as needed for nausea or vomiting.     predniSONE 10 MG tablet  Commonly known as:  DELTASONE  4 tablets x 2 days, 3 tabs x 2 days, 2 tabs x 2 days, 1 tab x 2 days     SUMAtriptan 100 MG tablet  Commonly known as:  IMITREX  Take 1 tablet (100 mg total) by mouth every 2 (two) hours as needed for migraine. May repeat in 2 hours if headache persists or recurs.           Objective:   Physical Exam BP 118/76 mmHg  Pulse 65  Temp(Src) 98 F (36.7 C) (Oral)  Ht 5\' 5"  (1.651 m)  Wt 132 lb (59.875 kg)  BMI 21.97 kg/m2  SpO2 97%  LMP 04/08/2015 (Approximate)    General:   Well developed, well nourished .  NAD.  HEENT:  Normocephalic . Face symmetric, atraumatic Neck: Range of motion normal, no TTP Lungs:  CTA B Normal respiratory effort, no intercostal retractions, no accessory muscle use. Heart: RRR,  no murmur.  No pretibial edema bilaterally  Skin: Not pale. Not jaundice Neurologic:  alert & oriented X3.  Speech normal, gait appropriate for age and unassisted DTRs and strength symmetric Psych--  Cognition and judgment appear intact.  Cooperative with normal attention span and concentration.  Behavior appropriate. No anxious or depressed appearing.   Assessment & Plan:

## 2015-05-06 NOTE — Progress Notes (Signed)
Pre visit review using our clinic review tool, if applicable. No additional management support is needed unless otherwise documented below in the visit note. 

## 2015-05-06 NOTE — Patient Instructions (Signed)
Will schedule your MRI  Please call if the headache lingers or if you have a severe episode

## 2015-05-06 NOTE — Assessment & Plan Note (Addendum)
Headache improving, of concern is he fact that the headache was prolonged and she has some neck pain Plan: Brain MRI to rule out other etiologies Continue Imitrex If headache continue lingering in the next few days, will start TOPAMAX and/or refer to neuro

## 2015-06-02 ENCOUNTER — Telehealth: Payer: Self-pay | Admitting: Internal Medicine

## 2015-06-02 NOTE — Telephone Encounter (Signed)
Was seen a few weeks ago with severe  headache, MRI was ordered but not done. Please follow-upon the test, also check on pt: better?

## 2015-06-03 NOTE — Telephone Encounter (Signed)
LMOM informing Pt to return call.  

## 2015-06-03 NOTE — Telephone Encounter (Signed)
We needed copy of new insurance card, Pt did not have at time. Awaiting her insurance card to be mailed to her.

## 2015-06-03 NOTE — Telephone Encounter (Signed)
thx

## 2015-06-05 NOTE — Telephone Encounter (Signed)
Have been unable to contact Pt.

## 2015-07-27 ENCOUNTER — Other Ambulatory Visit: Payer: Self-pay | Admitting: Internal Medicine

## 2015-08-05 ENCOUNTER — Ambulatory Visit (INDEPENDENT_AMBULATORY_CARE_PROVIDER_SITE_OTHER): Payer: BLUE CROSS/BLUE SHIELD | Admitting: Family Medicine

## 2015-08-05 ENCOUNTER — Encounter: Payer: Self-pay | Admitting: Family Medicine

## 2015-08-05 VITALS — BP 120/80 | HR 73 | Temp 98.7°F | Ht 65.0 in | Wt 136.2 lb

## 2015-08-05 DIAGNOSIS — G43009 Migraine without aura, not intractable, without status migrainosus: Secondary | ICD-10-CM

## 2015-08-05 DIAGNOSIS — Z23 Encounter for immunization: Secondary | ICD-10-CM

## 2015-08-05 DIAGNOSIS — R002 Palpitations: Secondary | ICD-10-CM | POA: Diagnosis not present

## 2015-08-05 DIAGNOSIS — Z0000 Encounter for general adult medical examination without abnormal findings: Secondary | ICD-10-CM

## 2015-08-05 LAB — COMPREHENSIVE METABOLIC PANEL
ALBUMIN: 4.1 g/dL (ref 3.5–5.2)
ALK PHOS: 51 U/L (ref 39–117)
ALT: 10 U/L (ref 0–35)
AST: 14 U/L (ref 0–37)
BILIRUBIN TOTAL: 0.9 mg/dL (ref 0.2–1.2)
BUN: 18 mg/dL (ref 6–23)
CO2: 30 mEq/L (ref 19–32)
Calcium: 9.3 mg/dL (ref 8.4–10.5)
Chloride: 105 mEq/L (ref 96–112)
Creatinine, Ser: 0.79 mg/dL (ref 0.40–1.20)
GFR: 81.46 mL/min (ref >60.00)
GLUCOSE: 87 mg/dL (ref 70–99)
Potassium: 4.3 mEq/L (ref 3.5–5.1)
SODIUM: 140 meq/L (ref 135–145)
TOTAL PROTEIN: 6.9 g/dL (ref 6.0–8.3)

## 2015-08-05 LAB — CBC
HEMATOCRIT: 41 % (ref 36.0–46.0)
Hemoglobin: 13.6 g/dL (ref 12.0–15.0)
MCHC: 33.2 g/dL (ref 30.0–36.0)
MCV: 97.6 fl (ref 78.0–100.0)
Platelets: 218 10*3/uL (ref 150.0–400.0)
RBC: 4.2 Mil/uL (ref 3.87–5.11)
RDW: 12.9 % (ref 11.5–15.5)
WBC: 7.2 10*3/uL (ref 4.0–10.5)

## 2015-08-05 LAB — LIPID PANEL
CHOLESTEROL: 144 mg/dL (ref 0–200)
HDL: 61.3 mg/dL (ref >39.00)
LDL Cholesterol: 74 mg/dL (ref 0–99)
NonHDL: 83.12
Total CHOL/HDL Ratio: 2
Triglycerides: 46 mg/dL (ref 0.0–149.0)
VLDL: 9.2 mg/dL (ref 0.0–40.0)

## 2015-08-05 LAB — TSH: TSH: 0.88 u[IU]/mL (ref 0.35–4.50)

## 2015-08-05 LAB — MAGNESIUM: MAGNESIUM: 2.2 mg/dL (ref 1.5–2.5)

## 2015-08-05 NOTE — Assessment & Plan Note (Signed)
Was seen in July with 10 day migraine headache. Imitrex has been helpful. Only one bad migraine since July. Has used Imitrex intermittenly roughly twice monthly to interrupt the development of a bad migraine with good results.  Has been having 2-3 tension headaches a month for past 2 years, craniosacral therapy has pushed them out from 3 to 2 a month.. Since seen

## 2015-08-05 NOTE — Progress Notes (Signed)
Subjective:    Patient ID: Nicole Lynch, female    DOB: [birth year], 49 y.o.   MRN: [id number]  Chief Complaint  Patient presents with  . Follow-up    HPI Patient is in today for follow-up. Had a very bad migraine that lasted 10 days and ultimately had to come in here for shot. She had had some chiropractic manipulations) the headache occurred. With treatment the headache did resolve. Prior to that she had not had headaches for quite some time. At present no headache. She feels well. No recent illness she has had some intermittent palpitations but none with associated symptoms. Denies CP/SOB/HA/congestion/fevers/GI or GU c/o. Taking meds as prescribed  Past Medical History  Diagnosis Date  . Chicken pox as a child  . Migraine   . Allergy     seasonal  . 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 Sinusitis acute 06/29/2011    Past Surgical History  Procedure Laterality Date  . Cesarean section  2-05, 10-07    X 2  . Deviated septum repair  1982    Family History  Problem Relation Age of Onset  . Vision loss Mother     macular degeneration  . Heart disease Father     afib on blood thinner  . Cancer Father     prostate cancer    Social History   Social History  . Marital Status: Married    Spouse Name: N/A  . Number of Children: 2  . Years of Education: N/A   Occupational History  . Conservation officer, [employer]     Social History Main Topics  . Smoking status: Never Smoker   . Smokeless tobacco: Never Used  . Alcohol Use: No     Comment:    . Drug Use: No  . Sexual Activity: Not on file   Other Topics Concern  . Not on file   Social History Narrative    Outpatient Prescriptions Prior to Visit  Medication Sig Dispense Refill  . albuterol (PROVENTIL HFA;VENTOLIN HFA) 108 (90 BASE) MCG/ACT inhaler Inhale 2 puffs into the lungs every 6 (six) hours as needed for wheezing or shortness of breath.    . Multiple Vitamin (MULTIVITAMIN) tablet Take 1 tablet by mouth daily. Takes Q3 days      . OMEGA 3 1000 MG CAPS Take 1 capsule by mouth daily. KRILL OIL    . ondansetron (ZOFRAN) 4 MG tablet Take 1 tablet (4 mg total) by mouth every 8 (eight) hours as needed for nausea or vomiting. 20 tablet 0  . SUMAtriptan (IMITREX) 100 MG tablet Take 1 tablet (100 mg total) by mouth every 2 (two) hours as needed for migraine. 10 tablet 0  . co-enzyme Q-10 30 MG capsule Take 30 mg by mouth daily.    . Misc Natural Products (GLUCOSAMINE CHONDROITIN MSM PO) Take by mouth daily.     No facility-administered medications prior to visit.    No Known Allergies  Review of Systems  Constitutional: Negative for fever and malaise/fatigue.  HENT: Negative for congestion.   Eyes: Negative for discharge.  Respiratory: Negative for shortness of breath.   Cardiovascular: Negative for chest pain, palpitations and leg swelling.  Gastrointestinal: Negative for nausea and abdominal pain.  Genitourinary: Negative for dysuria.  Musculoskeletal: Negative for falls.  Skin: Negative for rash.  Neurological: Negative for loss of consciousness and headaches.  Endo/Heme/Allergies: Negative for environmental allergies.  Psychiatric/Behavioral: Negative for depression. The patient is not nervous/anxious.  Objective:    Physical Exam  Constitutional: She is oriented to person, place, and time. She appears well-developed and well-nourished. No distress.  HENT:  Head: Normocephalic and atraumatic.  Nose: Nose normal.  Eyes: Right eye exhibits no discharge. Left eye exhibits no discharge.  Neck: Normal range of motion. Neck supple.  Cardiovascular: Normal rate and regular rhythm.   No murmur heard. Pulmonary/Chest: Effort normal and breath sounds normal.  Abdominal: Soft. Bowel sounds are normal. There is no tenderness.  Musculoskeletal: She exhibits no edema.  Neurological: She is alert and oriented to person, place, and time.  Skin: Skin is warm and dry.  Psychiatric: She has a normal mood and  affect.  Nursing note and vitals reviewed.   BP 120/80 mmHg  Pulse 73  Temp(Src) 98.7 F (37.1 C) (Oral)  Ht 5' 5"  (1.651 m)  Wt 136 lb 4 oz (61.803 kg)  BMI 22.67 kg/m2  SpO2 97% Wt Readings from Last 3 Encounters:  08/05/15 136 lb 4 oz (61.803 kg)  05/06/15 132 lb (59.875 kg)  04/25/15 132 lb 8 oz (60.102 kg)     Lab Results  Component Value Date   WBC 7.2 08/05/2015   HGB 13.6 08/05/2015   HCT 41.0 08/05/2015   PLT 218.0 08/05/2015   GLUCOSE 87 08/05/2015   CHOL 144 08/05/2015   TRIG 46.0 08/05/2015   HDL 61.30 08/05/2015   LDLCALC 74 08/05/2015   ALT 10 08/05/2015   AST 14 08/05/2015   NA 140 08/05/2015   K 4.3 08/05/2015   CL 105 08/05/2015   CREATININE 0.79 08/05/2015   BUN 18 08/05/2015   CO2 30 08/05/2015   TSH 0.88 08/05/2015    Lab Results  Component Value Date   TSH 0.88 08/05/2015   Lab Results  Component Value Date   WBC 7.2 08/05/2015   HGB 13.6 08/05/2015   HCT 41.0 08/05/2015   MCV 97.6 08/05/2015   PLT 218.0 08/05/2015   Lab Results  Component Value Date   NA 140 08/05/2015   K 4.3 08/05/2015   CO2 30 08/05/2015   GLUCOSE 87 08/05/2015   BUN 18 08/05/2015   CREATININE 0.79 08/05/2015   BILITOT 0.9 08/05/2015   ALKPHOS 51 08/05/2015   AST 14 08/05/2015   ALT 10 08/05/2015   PROT 6.9 08/05/2015   ALBUMIN 4.1 08/05/2015   CALCIUM 9.3 08/05/2015   GFR 81.46 08/05/2015      Assessment & Plan:   Problem List Items Addressed This Visit    Palpitations    Had a recent flare and was seen by cardiology, that her husband sees, with increase in potassium it improved      Relevant Orders   TSH (Completed)   CBC (Completed)   Lipid panel (Completed)   Comp Met (CMET) (Completed)   Magnesium (Completed)   Migraine    Was seen in July with 10 day migraine headache. Imitrex has been helpful. Only one bad migraine since July. Has used Imitrex intermittenly roughly twice monthly to interrupt the development of a bad migraine with good  results.  Has been having 2-3 tension headaches a month for past 2 years, craniosacral therapy has pushed them out from 3 to 2 a month.. Since seen       Relevant Orders   TSH (Completed)   CBC (Completed)   Lipid panel (Completed)   Comp Met (CMET) (Completed)   Magnesium (Completed)    Other Visit Diagnoses    Encounter for immunization    -  Primary    Preventative health care        Relevant Orders    TSH (Completed)    CBC (Completed)    Lipid panel (Completed)    Comp Met (CMET) (Completed)    Magnesium (Completed)       I am having Ms. Mikkelsen maintain her multivitamin, Omega 3, co-enzyme Q-10, Misc Natural Products (GLUCOSAMINE CHONDROITIN MSM PO), albuterol, ondansetron, and SUMAtriptan.  No orders of the defined types were placed in this encounter.     Penni Homans, MD

## 2015-08-05 NOTE — Assessment & Plan Note (Signed)
Had a recent flare and was seen by cardiology, that her husband sees, with increase in potassium it improved

## 2015-08-05 NOTE — Progress Notes (Signed)
Pre visit review using our clinic review tool, if applicable. No additional management support is needed unless otherwise documented below in the visit note. 

## 2015-08-05 NOTE — Patient Instructions (Signed)

## 2015-10-10 ENCOUNTER — Other Ambulatory Visit: Payer: Self-pay | Admitting: Internal Medicine

## 2015-11-12 ENCOUNTER — Telehealth: Payer: Self-pay | Admitting: Family Medicine

## 2015-11-12 NOTE — Telephone Encounter (Signed)
Caller name: Briony   Relationship to patient: Self  Can be reached: [phone]  Reason for call:  pt says that her insurance will approve Hemmorex 30 ML, she says that the provider sent a lesser dosage. She would like to know if the correct dosage could be sent in .    Thanks.

## 2015-11-12 NOTE — Telephone Encounter (Signed)
Not sure what this is, should it be Imitrex #30? If so that would be fine with me

## 2015-11-13 NOTE — Telephone Encounter (Signed)
Spoke to the patient and hemmorex 30 mg is a hemorrhoid suppository that is approved by her insurance and that is what she wants?  Patient would like to have sent in for her. [facility]. [facility] [address] [place].

## 2015-11-14 ENCOUNTER — Emergency Department (HOSPITAL_BASED_OUTPATIENT_CLINIC_OR_DEPARTMENT_OTHER): Payer: BLUE CROSS/BLUE SHIELD

## 2015-11-14 ENCOUNTER — Emergency Department (HOSPITAL_BASED_OUTPATIENT_CLINIC_OR_DEPARTMENT_OTHER)
Admission: EM | Admit: 2015-11-14 | Discharge: 2015-11-14 | Disposition: A | Discharge status: Home / Self Care | Payer: BLUE CROSS/BLUE SHIELD | Attending: Emergency Medicine | Admitting: Emergency Medicine

## 2015-11-14 DIAGNOSIS — Z8709 Personal history of other diseases of the respiratory system: Secondary | ICD-10-CM | POA: Insufficient documentation

## 2015-11-14 DIAGNOSIS — G43009 Migraine without aura, not intractable, without status migrainosus: Secondary | ICD-10-CM

## 2015-11-14 DIAGNOSIS — G43909 Migraine, unspecified, not intractable, without status migrainosus: Secondary | ICD-10-CM | POA: Diagnosis present

## 2015-11-14 DIAGNOSIS — Z79899 Other long term (current) drug therapy: Secondary | ICD-10-CM | POA: Insufficient documentation

## 2015-11-14 DIAGNOSIS — Z8659 Personal history of other mental and behavioral disorders: Secondary | ICD-10-CM | POA: Diagnosis not present

## 2015-11-14 DIAGNOSIS — Z8619 Personal history of other infectious and parasitic diseases: Secondary | ICD-10-CM | POA: Insufficient documentation

## 2015-11-14 MED ORDER — DIPHENHYDRAMINE HCL 50 MG/ML IJ SOLN
50.0000 mg | Freq: Once | INTRAMUSCULAR | Status: AC
  Administered 2015-11-14: 50 mg via INTRAVENOUS
  Filled 2015-11-14: qty 1

## 2015-11-14 MED ORDER — SODIUM CHLORIDE 0.9 % IV BOLUS (SEPSIS)
1000.0000 mL | Freq: Once | INTRAVENOUS | Status: AC
  Administered 2015-11-14: 1000 mL via INTRAVENOUS

## 2015-11-14 MED ORDER — METOCLOPRAMIDE HCL 5 MG/ML IJ SOLN
10.0000 mg | Freq: Once | INTRAMUSCULAR | Status: AC
  Administered 2015-11-14: 10 mg via INTRAVENOUS
  Filled 2015-11-14: qty 2

## 2015-11-14 MED ORDER — DEXAMETHASONE SODIUM PHOSPHATE 10 MG/ML IJ SOLN
10.0000 mg | Freq: Once | INTRAMUSCULAR | Status: AC
  Administered 2015-11-14: 10 mg via INTRAVENOUS
  Filled 2015-11-14: qty 1

## 2015-11-14 MED ORDER — ONDANSETRON HCL 4 MG PO TABS: 4.0000 mg | ORAL_TABLET | Freq: Three times a day (TID) | ORAL | Status: DC | PRN

## 2015-11-14 MED ORDER — KETOROLAC TROMETHAMINE 30 MG/ML IJ SOLN: 30.0000 mg | Freq: Once | INTRAMUSCULAR | Status: DC

## 2015-11-14 MED ORDER — SUMATRIPTAN SUCCINATE 100 MG PO TABS: 100.0000 mg | ORAL_TABLET | ORAL | Status: DC | PRN

## 2015-11-14 NOTE — Telephone Encounter (Signed)
This does not come up in our EMR. Please call her pharmacy and ask what are the ingredients in this suppository and/or if there is another product with the same ingredients such as Anusol HC, etc? Then we can write

## 2015-11-14 NOTE — ED Notes (Signed)
Pt awake from sleep d/t severe headache pain , hx migraines admit to nausea but no emesis. Denies fever but admits to photophobia

## 2015-11-14 NOTE — Discharge Instructions (Signed)

## 2015-11-14 NOTE — ED Provider Notes (Signed)
TIME SEEN: 5:10 AM  CHIEF COMPLAINT: Headache  HPI: Pt is a 50 y.o. female with history of migraine who presents to emergency department with complaints of severe left-sided throbbing, sharp headache that started at 3 AM and woke her from sleep. Denies any head injury. Not on anticoagulation. No neck pain or neck stiffness. No fever. No numbness or focal weakness. States she feels weak all over. She does have photophobia, phonophobia. Reports tried Imitrex and Zofran with no relief. States this feels different than her migraine headaches because her migraines are normally dull and this is sharp. States she has never had a headache like this before. Husband states she he told her this was "the worst headache of my life". Patient does state that she has been to the hospital several times before for treatment of her migraine headaches. Complains of left ear pain for the past several days. States she went to bed at 10 PM without headache.  When she has gone to the hospital before for her headaches, she is not sure what medication has helped her.  ROS: See HPI Constitutional: no fever  Eyes: no drainage  ENT: no runny nose   Cardiovascular:  no chest pain  Resp: no SOB  GI: no vomiting GU: no dysuria Integumentary: no rash  Allergy: no hives  Musculoskeletal: no leg swelling  Neurological: no slurred speech ROS otherwise negative  PAST MEDICAL HISTORY/PAST SURGICAL HISTORY:  Past Medical History  Diagnosis Date  . Chicken pox as a child  . Migraine   . Allergy     seasonal  . 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 Sinusitis acute 06/29/2011    MEDICATIONS:  Prior to Admission medications   Medication Sig Start Date End Date Taking? Authorizing Provider  albuterol (PROVENTIL HFA;VENTOLIN HFA) 108 (90 BASE) MCG/ACT inhaler Inhale 2 puffs into the lungs every 6 (six) hours as needed for wheezing or shortness of breath.    Historical Provider, MD  co-enzyme Q-10 30 MG capsule Take 30 mg by mouth  daily.    Historical Provider, MD  Misc Natural Products (GLUCOSAMINE CHONDROITIN MSM PO) Take by mouth daily.    Historical Provider, MD  Multiple Vitamin (MULTIVITAMIN) tablet Take 1 tablet by mouth daily. Takes Q3 days    Historical Provider, MD  OMEGA 3 1000 MG CAPS Take 1 capsule by mouth daily. KRILL OIL    Historical Provider, MD  ondansetron (ZOFRAN) 4 MG tablet Take 1 tablet (4 mg total) by mouth every 8 (eight) hours as needed for nausea or vomiting. 04/25/15   Colon Branch, MD  SUMAtriptan (IMITREX) 100 MG tablet Take 1 tablet (100 mg total) by mouth every 2 (two) hours as needed for migraine. 10/11/15   Colon Branch, MD    ALLERGIES:  No Known Allergies  SOCIAL HISTORY:  Social History  Substance Use Topics  . Smoking status: Never Smoker   . Smokeless tobacco: Never Used  . Alcohol Use: No     Comment:      FAMILY HISTORY: Family History  Problem Relation Age of Onset  . Vision loss Mother     macular degeneration  . Heart disease Father     afib on blood thinner  . Cancer Father     prostate cancer    EXAM: BP 147/86 mmHg  Pulse 82  Temp(Src) 98.8 F (37.1 C) (Oral)  Resp 20  Ht 5\' 7"  (1.702 m)  Wt 136 lb (61.689 kg)  BMI 21.30  kg/m2  SpO2 100%  LMP 11/13/2015 CONSTITUTIONAL: Alert and oriented and responds appropriately to questions. Well-nourished, afebrile, nontoxic, appears uncomfortable, covering her eyes HEAD: Normocephalic EYES: Conjunctivae clear, PERRL, patient has photophobia ENT: normal nose; no rhinorrhea; moist mucous membranes; pharynx without lesions noted, left TM appears normal without erythema, purulence, bulging, perforation, no cerumen impaction, no signs of mastoiditis, no pain with manipulation of the pinna NECK: Supple, no meningismus, no LAD, no nuchal rigidity  CARD: RRR; S1 and S2 appreciated; no murmurs, no clicks, no rubs, no gallops RESP: Normal chest excursion without splinting or tachypnea; breath sounds clear and equal  bilaterally; no wheezes, no rhonchi, no rales, no hypoxia or respiratory distress, speaking full sentences ABD/GI: Normal bowel sounds; non-distended; soft, non-tender, no rebound, no guarding, no peritoneal signs BACK:  The back appears normal and is non-tender to palpation, there is no CVA tenderness EXT: Normal ROM in all joints; non-tender to palpation; no edema; normal capillary refill; no cyanosis, no calf tenderness or swelling    SKIN: Normal color for age and race; warm, no rash NEURO: Moves all extremities equally, sensation to light touch intact diffusely, cranial nerves II through XII intact, no dysarthria or aphasia PSYCH: The patient's mood and manner are appropriate. Grooming and personal hygiene are appropriate.  MEDICAL DECISION MAKING: Patient here with severe headache that started at 3 AM. Describes this as different than her previous migraines but husband does state she has had to come to the hospital before for treatment of her migraine headaches. Tried Imitrex and Zofran without relief. Headache is unilateral, sharp, throbbing associated with nausea, photophobia and phonophobia. No focal neurologic deficit, fever, meningismus. I suspect that this is a migraine headache but given she is describing it as different than her previous headaches and severe will obtain a head CT to rule out intracranial hemorrhage. She is within a six-hour window therefore if head CT is negative for bleed I feel we have successfully ruled out intracranial hemorrhage. We'll treat her headache with IV fluids, Reglan, Benadryl, Decadron.  ED PROGRESS: 6:35 AM  Pt's head CT is unremarkable. Pain is improved she states her headache is almost completely gone. Have offered her a dose of Toradol but she declined stating she feels much better. I suspect that this was a migraine headache. I do not feel she needs further emergent workup. Given we obtained a head CT within 6 hours of onset of headache and think we have  successfully ruled out intracranial hemorrhage. Doubt infectious etiology. Will discharge with results for her Imitrex and Zofran. Discussed return precautions. Patient verbalized understanding and is comfortable with this plan. Patient's husband will drive her home.     Pepper Pike, DO 11/14/15 [phone]

## 2015-11-15 NOTE — Telephone Encounter (Signed)
Called pharmacy and they stated the hemmorax ingrediants is just hydrocortisone. Which is the same as the anusol hc? I was able to find this rx in meds and orders which you like me to order this?

## 2015-11-16 NOTE — Telephone Encounter (Signed)
That would be great please order as the pharmacy directed. 1 suppository pr bid prn hemorrhoids. Disp  #20 with 5 rf unless patient requested a different quantity.

## 2015-11-18 MED ORDER — HYDROCORTISONE ACETATE 25 MG RE SUPP: 25.0000 mg | Freq: Two times a day (BID) | RECTAL | Status: DC | PRN

## 2015-11-18 NOTE — Telephone Encounter (Signed)
Sent order over for hemmorax 25 mg 1 sup bid as needed #20 with 5 rf.

## 2015-12-11 ENCOUNTER — Telehealth: Payer: Self-pay | Admitting: Family Medicine

## 2015-12-11 NOTE — Telephone Encounter (Signed)
Pt would like a call back to discuss a ENT location. Pt isn't sure if she need referral from PCP per insurance. Verified, pt's insurance is the most recent.   Please call pt back to assist further.   CB: [phone]

## 2015-12-12 NOTE — Telephone Encounter (Signed)
I do not see a ENT referral, please advise

## 2015-12-12 NOTE — Telephone Encounter (Signed)
I do not know where offices are, I am happy to refer but if she needs to do it based on location then please have referral desk help. Am happy to place referral do not know if her insurance requires it

## 2015-12-13 NOTE — Telephone Encounter (Signed)
Pt does not need dr to dr referral, pt will call ENT and self scheduled.

## 2015-12-13 NOTE — Telephone Encounter (Signed)
Lm on vm, awaiting return call °

## 2016-02-03 ENCOUNTER — Ambulatory Visit (INDEPENDENT_AMBULATORY_CARE_PROVIDER_SITE_OTHER): Payer: BLUE CROSS/BLUE SHIELD | Admitting: Family Medicine

## 2016-02-03 ENCOUNTER — Encounter: Payer: Self-pay | Admitting: Family Medicine

## 2016-02-03 VITALS — BP 112/84 | HR 77 | Temp 98.5°F | Ht 66.0 in | Wt 134.0 lb

## 2016-02-03 DIAGNOSIS — K648 Other hemorrhoids: Secondary | ICD-10-CM

## 2016-02-03 DIAGNOSIS — H6592 Unspecified nonsuppurative otitis media, left ear: Secondary | ICD-10-CM | POA: Diagnosis not present

## 2016-02-03 DIAGNOSIS — R002 Palpitations: Secondary | ICD-10-CM

## 2016-02-03 DIAGNOSIS — T7840XS Allergy, unspecified, sequela: Secondary | ICD-10-CM

## 2016-02-03 DIAGNOSIS — H8102 Meniere's disease, left ear: Secondary | ICD-10-CM | POA: Insufficient documentation

## 2016-02-03 DIAGNOSIS — G43009 Migraine without aura, not intractable, without status migrainosus: Secondary | ICD-10-CM | POA: Diagnosis not present

## 2016-02-03 DIAGNOSIS — H6590 Unspecified nonsuppurative otitis media, unspecified ear: Secondary | ICD-10-CM

## 2016-02-03 DIAGNOSIS — Z0000 Encounter for general adult medical examination without abnormal findings: Secondary | ICD-10-CM

## 2016-02-03 HISTORY — DX: Meniere's disease, left ear: H81.02

## 2016-02-03 HISTORY — DX: Encounter for general adult medical examination without abnormal findings: Z00.00

## 2016-02-03 HISTORY — DX: Unspecified nonsuppurative otitis media, unspecified ear: H65.90

## 2016-02-03 LAB — CBC
HEMATOCRIT: 40.7 % (ref 36.0–46.0)
Hemoglobin: 13.7 g/dL (ref 12.0–15.0)
MCHC: 33.7 g/dL (ref 30.0–36.0)
MCV: 96.8 fl (ref 78.0–100.0)
PLATELETS: 176 10*3/uL (ref 150.0–400.0)
RBC: 4.21 Mil/uL (ref 3.87–5.11)
RDW: 12.6 % (ref 11.5–15.5)
WBC: 5.3 10*3/uL (ref 4.0–10.5)

## 2016-02-03 LAB — LIPID PANEL
CHOLESTEROL: 161 mg/dL (ref 0–200)
HDL: 63.2 mg/dL (ref >39.00)
LDL CALC: 86 mg/dL (ref 0–99)
NonHDL: 97.39
TRIGLYCERIDES: 56 mg/dL (ref 0.0–149.0)
Total CHOL/HDL Ratio: 3
VLDL: 11.2 mg/dL (ref 0.0–40.0)

## 2016-02-03 LAB — COMPREHENSIVE METABOLIC PANEL
ALBUMIN: 4.4 g/dL (ref 3.5–5.2)
ALK PHOS: 51 U/L (ref 39–117)
ALT: 13 U/L (ref 0–35)
AST: 16 U/L (ref 0–37)
BILIRUBIN TOTAL: 0.8 mg/dL (ref 0.2–1.2)
BUN: 20 mg/dL (ref 6–23)
CALCIUM: 10 mg/dL (ref 8.4–10.5)
CHLORIDE: 103 meq/L (ref 96–112)
CO2: 28 mEq/L (ref 19–32)
CREATININE: 0.95 mg/dL (ref 0.40–1.20)
GFR: 65.71 mL/min (ref >60.00)
Glucose, Bld: 78 mg/dL (ref 70–99)
Potassium: 4.1 mEq/L (ref 3.5–5.1)
Sodium: 139 mEq/L (ref 135–145)
TOTAL PROTEIN: 7.3 g/dL (ref 6.0–8.3)

## 2016-02-03 LAB — HEPATITIS C ANTIBODY: HCV Ab: NEGATIVE

## 2016-02-03 LAB — HIV ANTIBODY (ROUTINE TESTING W REFLEX): HIV: NONREACTIVE

## 2016-02-03 MED ORDER — HYDROCORTISONE ACETATE 30 MG RE SUPP: 1.0000 | Freq: Every evening | RECTAL | Status: DC | PRN

## 2016-02-03 NOTE — Progress Notes (Signed)
Subjective:    Patient ID: Nicole Lynch, female    DOB: [birth year], 50 y.o.   MRN: [id number]  Chief Complaint  Patient presents with  . Annual Exam    HPI Patient is in today for annual exam. She feels well today. She does note some ear pressure popping at times. No fevers, chills or other signs of acute illness. Has had an occasional migraine headache. None today. Denies CP/palp/SOB/congestion/fevers/GI or GU c/o. Taking meds as prescribed  Past Medical History  Diagnosis Date  . Chicken pox as a child  . Migraine   . Allergy     seasonal  . 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 Sinusitis acute 06/29/2011  . Preventative health care 02/03/2016  . SOM (serous otitis media) 02/03/2016    Past Surgical History  Procedure Laterality Date  . Cesarean section  2-05, 10-07    X 2  . Deviated septum repair  1982    Family History  Problem Relation Age of Onset  . Vision loss Mother     macular degeneration  . Heart disease Father     afib on blood thinner  . Cancer Father     prostate cancer    Social History   Social History  . Marital Status: Married    Spouse Name: N/A  . Number of Children: 2  . Years of Education: N/A   Occupational History  . Conservation officer, [employer]     Social History Main Topics  . Smoking status: Never Smoker   . Smokeless tobacco: Never Used  . Alcohol Use: No     Comment:    . Drug Use: No  . Sexual Activity: Not on file   Other Topics Concern  . Not on file   Social History Narrative    Outpatient Prescriptions Prior to Visit  Medication Sig Dispense Refill  . Multiple Vitamin (MULTIVITAMIN) tablet Take 1 tablet by mouth daily. Takes Q3 days    . SUMAtriptan (IMITREX) 100 MG tablet Take 1 tablet (100 mg total) by mouth every 2 (two) hours as needed for migraine. 10 tablet 2  . albuterol (PROVENTIL HFA;VENTOLIN HFA) 108 (90 BASE) MCG/ACT inhaler Inhale 2 puffs into the lungs every 6 (six) hours as needed for wheezing or shortness of breath.  Reported on 02/03/2016    . co-enzyme Q-10 30 MG capsule Take 30 mg by mouth daily. Reported on 02/03/2016    . hydrocortisone (HEMMOREX-HC) 25 MG suppository Place 1 suppository (25 mg total) rectally 2 (two) times daily as needed for hemorrhoids or itching. (Patient not taking: Reported on 02/03/2016) 20 suppository 5  . Misc Natural Products (GLUCOSAMINE CHONDROITIN MSM PO) Take by mouth daily. Reported on 02/03/2016    . OMEGA 3 1000 MG CAPS Take 1 capsule by mouth daily. Reported on 02/03/2016    . ondansetron (ZOFRAN) 4 MG tablet Take 1 tablet (4 mg total) by mouth every 8 (eight) hours as needed for nausea or vomiting. (Patient not taking: Reported on 02/03/2016) 20 tablet 0   No facility-administered medications prior to visit.    No Known Allergies  Review of Systems  Constitutional: Negative for fever, chills and malaise/fatigue.  HENT: Positive for ear pain. Negative for congestion and hearing loss.   Eyes: Negative for discharge.  Respiratory: Negative for cough, sputum production and shortness of breath.   Cardiovascular: Negative for chest pain, palpitations and leg swelling.  Gastrointestinal: Negative for heartburn, nausea, vomiting, abdominal pain, diarrhea, constipation and  blood in stool.  Genitourinary: Negative for dysuria, urgency, frequency and hematuria.  Musculoskeletal: Negative for myalgias, back pain and falls.  Skin: Negative for rash.  Neurological: Positive for headaches. Negative for dizziness, sensory change, loss of consciousness and weakness.  Endo/Heme/Allergies: Negative for environmental allergies. Does not bruise/bleed easily.  Psychiatric/Behavioral: Negative for depression and suicidal ideas. The patient is not nervous/anxious and does not have insomnia.        Objective:    Physical Exam  Constitutional: She is oriented to person, place, and time. She appears well-developed and well-nourished. No distress.  HENT:  Head: Normocephalic and  atraumatic.  Right Ear: External ear normal.  Left Ear: External ear normal.  Nose: Nose normal.  Mouth/Throat: Oropharynx is clear and moist.  Eyes: Conjunctivae and EOM are normal. Pupils are equal, round, and reactive to light. Right eye exhibits no discharge. Left eye exhibits no discharge.  Neck: Normal range of motion. Neck supple. No JVD present. No thyromegaly present.  Cardiovascular: Normal rate, regular rhythm, normal heart sounds and intact distal pulses.   No murmur heard. Pulmonary/Chest: Effort normal and breath sounds normal. No respiratory distress. She has no wheezes. She has no rales. She exhibits no tenderness.  Abdominal: Soft. Bowel sounds are normal. She exhibits no distension and no mass. There is no tenderness. There is no rebound and no guarding.  Genitourinary: Vagina normal and uterus normal. Guaiac negative stool. No vaginal discharge found.  Musculoskeletal: Normal range of motion. She exhibits no edema or tenderness.  Lymphadenopathy:    She has no cervical adenopathy.  Neurological: She is alert and oriented to person, place, and time. She has normal reflexes. No cranial nerve deficit.  Skin: Skin is warm and dry. No rash noted. She is not diaphoretic. No erythema.  Psychiatric: She has a normal mood and affect. Her behavior is normal. Judgment and thought content normal.  Nursing note and vitals reviewed.   BP 112/84 mmHg  Pulse 77  Temp(Src) 98.5 F (36.9 C) (Oral)  Ht 5\' 6"  (1.676 m)  Wt 134 lb (60.782 kg)  BMI 21.64 kg/m2  SpO2 97%  LMP 01/05/2016 Wt Readings from Last 3 Encounters:  02/03/16 134 lb (60.782 kg)  11/14/15 136 lb (61.689 kg)  08/05/15 136 lb 4 oz (61.803 kg)     Lab Results  Component Value Date   WBC 7.2 08/05/2015   HGB 13.6 08/05/2015   HCT 41.0 08/05/2015   PLT 218.0 08/05/2015   GLUCOSE 87 08/05/2015   CHOL 144 08/05/2015   TRIG 46.0 08/05/2015   HDL 61.30 08/05/2015   LDLCALC 74 08/05/2015   ALT 10 08/05/2015    AST 14 08/05/2015   NA 140 08/05/2015   K 4.3 08/05/2015   CL 105 08/05/2015   CREATININE 0.79 08/05/2015   BUN 18 08/05/2015   CO2 30 08/05/2015   TSH 0.88 08/05/2015    Lab Results  Component Value Date   TSH 0.88 08/05/2015   Lab Results  Component Value Date   WBC 7.2 08/05/2015   HGB 13.6 08/05/2015   HCT 41.0 08/05/2015   MCV 97.6 08/05/2015   PLT 218.0 08/05/2015   Lab Results  Component Value Date   NA 140 08/05/2015   K 4.3 08/05/2015   CO2 30 08/05/2015   GLUCOSE 87 08/05/2015   BUN 18 08/05/2015   CREATININE 0.79 08/05/2015   BILITOT 0.9 08/05/2015   ALKPHOS 51 08/05/2015   AST 14 08/05/2015   ALT 10 08/05/2015  PROT 6.9 08/05/2015   ALBUMIN 4.1 08/05/2015   CALCIUM 9.3 08/05/2015   GFR 81.46 08/05/2015   Lab Results  Component Value Date   CHOL 144 08/05/2015   Lab Results  Component Value Date   HDL 61.30 08/05/2015   Lab Results  Component Value Date   LDLCALC 74 08/05/2015   Lab Results  Component Value Date   TRIG 46.0 08/05/2015   Lab Results  Component Value Date   CHOLHDL 2 08/05/2015   No results found for: HGBA1C     Assessment & Plan:   Problem List Items Addressed This Visit    Allergic state   Migraine - Primary    Encouraged increased hydration, 64 ounces of clear fluids daily. Minimize alcohol and caffeine. Eat small frequent meals with lean proteins and complex carbs. Avoid high and low blood sugars. Get adequate sleep, 7-8 hours a night. Needs exercise daily preferably in the morning.      Relevant Medications   triamterene-hydrochlorothiazide (DYAZIDE) 37.5-25 MG capsule   Palpitations    Minimize caffene      Preventative health care    Patient encouraged to maintain heart healthy diet, regular exercise, adequate sleep. Consider daily probiotics. Take medications as prescribed. Labs ordered today Given and reviewed copy of ACP documents from [employer] and encouraged to complete and return       SOM (serous otitis media)    Left ear only, following Dr Cresenciano Lick at the ear center, will request records         I am having Ms. Dold maintain her multivitamin, Omega 3, co-enzyme Q-10, Misc Natural Products (GLUCOSAMINE CHONDROITIN MSM PO), albuterol, SUMAtriptan, ondansetron, hydrocortisone, and triamterene-hydrochlorothiazide.  Meds ordered this encounter  Medications  . triamterene-hydrochlorothiazide (DYAZIDE) 37.5-25 MG capsule    Sig: Take 1 capsule by mouth daily. Reported on 02/03/2016    Refill:  1     Penni Homans, MD

## 2016-02-03 NOTE — Assessment & Plan Note (Signed)
Minimize caffene

## 2016-02-03 NOTE — Assessment & Plan Note (Addendum)
Left ear only, following Dr Cresenciano Lick at the ear center, will request records

## 2016-02-03 NOTE — Assessment & Plan Note (Signed)
Encouraged increased hydration, 64 ounces of clear fluids daily. Minimize alcohol and caffeine. Eat small frequent meals with lean proteins and complex carbs. Avoid high and low blood sugars. Get adequate sleep, 7-8 hours a night. Needs exercise daily preferably in the morning.  

## 2016-02-03 NOTE — Assessment & Plan Note (Signed)
Patient encouraged to maintain heart healthy diet, regular exercise, adequate sleep. Consider daily probiotics. Take medications as prescribed. Labs ordered today. Given and reviewed copy of ACP documents from [place] Secretary of State and encouraged to complete and return 

## 2016-02-03 NOTE — Patient Instructions (Addendum)
[facility] Flonase 2 sprays each nostril daily  Serous Otitis Media Serous otitis media is fluid in the middle ear space. This space contains the bones for hearing and air. Air in the middle ear space helps to transmit sound.  The air gets there through the eustachian tube. This tube goes from the back of the nose (nasopharynx) to the middle ear space. It keeps the pressure in the middle ear the same as the outside world. It also helps to drain fluid from the middle ear space. CAUSES  Serous otitis media occurs when the eustachian tube gets blocked. Blockage can come from:  Ear infections.  Colds and other upper respiratory infections.  Allergies.  Irritants such as cigarette smoke.  Sudden changes in air pressure (such as descending in an airplane).  Enlarged adenoids.  A mass in the nasopharynx. During colds and upper respiratory infections, the middle ear space can become temporarily filled with fluid. This can happen after an ear infection also. Once the infection clears, the fluid will generally drain out of the ear through the eustachian tube. If it does not, then serous otitis media occurs. SIGNS AND SYMPTOMS   Hearing loss.  A feeling of fullness in the ear, without pain.  Young children may not show any symptoms but may show slight behavioral changes, such as agitation, ear pulling, or crying. DIAGNOSIS  Serous otitis media is diagnosed by an ear exam. Tests may be done to check on the movement of the eardrum. Hearing exams may also be done. TREATMENT  The fluid most often goes away without treatment. If allergy is the cause, allergy treatment may be helpful. Fluid that persists for several months may require minor surgery. A small tube is placed in the eardrum to:  Drain the fluid.  Restore the air in the middle ear space. In certain situations, antibiotic medicines are used to avoid surgery. Surgery may be done to remove enlarged adenoids (if  this is the cause). HOME CARE INSTRUCTIONS   Keep children away from tobacco smoke.  Keep all follow-up visits as directed by your health care provider. SEEK MEDICAL CARE IF: Preventive Care for Adults, Female A healthy lifestyle and preventive care can promote health and wellness. Preventive health guidelines for women include the following key practices. A routine yearly physical is a good way to check with your health care provider about your health and preventive screening. It is a chance to share any concerns and updates on your health and to receive a thorough exam. Visit your dentist for a routine exam and preventive care every 6 months. Brush your teeth twice a day and floss once a day. Good oral hygiene prevents tooth decay and gum disease. The frequency of eye exams is based on your age, health, family medical history, use of contact lenses, and other factors. Follow your health care provider's recommendations for frequency of eye exams. Eat a healthy diet. Foods like vegetables, fruits, whole grains, low-fat dairy products, and lean protein foods contain the nutrients you need without too many calories. Decrease your intake of foods high in solid fats, added sugars, and salt. Eat the right amount of calories for you.Get information about a proper diet from your health care provider, if necessary. Regular physical exercise is one of the most important things you can do for your health. Most adults should get at least 150 minutes of moderate-intensity exercise (any activity that increases your heart rate and causes you to sweat) each week. In addition,  most adults need muscle-strengthening exercises on 2 or more days a week. Maintain a healthy weight. The body mass index (BMI) is a screening tool to identify possible weight problems. It provides an estimate of body fat based on height and weight. Your health care provider can find your BMI and can help you achieve or maintain a healthy  weight.For adults 20 years and older: A BMI below 18.5 is considered underweight. A BMI of 18.5 to 24.9 is normal. A BMI of 25 to 29.9 is considered overweight. A BMI of 30 and above is considered obese. Maintain normal blood lipids and cholesterol levels by exercising and minimizing your intake of saturated fat. Eat a balanced diet with plenty of fruit and vegetables. Blood tests for lipids and cholesterol should begin at age 12 and be repeated every 5 years. If your lipid or cholesterol levels are high, you are over 50, or you are at high risk for heart disease, you may need your cholesterol levels checked more frequently.Ongoing high lipid and cholesterol levels should be treated with medicines if diet and exercise are not working. If you smoke, find out from your health care provider how to quit. If you do not use tobacco, do not start. Lung cancer screening is recommended for adults aged 42-80 years who are at high risk for developing lung cancer because of a history of smoking. A yearly low-dose CT scan of the lungs is recommended for people who have at least a 30-pack-year history of smoking and are a current smoker or have quit within the past 15 years. A pack year of smoking is smoking an average of 1 pack of cigarettes a day for 1 year (for example: 1 pack a day for 30 years or 2 packs a day for 15 years). Yearly screening should continue until the smoker has stopped smoking for at least 15 years. Yearly screening should be stopped for people who develop a health problem that would prevent them from having lung cancer treatment. If you are pregnant, do not drink alcohol. If you are breastfeeding, be very cautious about drinking alcohol. If you are not pregnant and choose to drink alcohol, do not have more than 1 drink per day. One drink is considered to be 12 ounces (355 mL) of beer, 5 ounces (148 mL) of wine, or 1.5 ounces (44 mL) of liquor. Avoid use of street drugs. Do not share needles with  anyone. Ask for help if you need support or instructions about stopping the use of drugs. High blood pressure causes heart disease and increases the risk of stroke. Your blood pressure should be checked at least every 1 to 2 years. Ongoing high blood pressure should be treated with medicines if weight loss and exercise do not work. If you are 71-42 years old, ask your health care provider if you should take aspirin to prevent strokes. Diabetes screening is done by taking a blood sample to check your blood glucose level after you have not eaten for a certain period of time (fasting). If you are not overweight and you do not have risk factors for diabetes, you should be screened once every 3 years starting at age 58. If you are overweight or obese and you are 27-72 years of age, you should be screened for diabetes every year as part of your cardiovascular risk assessment. Breast cancer screening is essential preventive care for women. You should practice "breast self-awareness." This means understanding the normal appearance and feel of your breasts and may  include breast self-examination. Any changes detected, no matter how small, should be reported to a health care provider. Women in their 42s and 30s should have a clinical breast exam (CBE) by a health care provider as part of a regular health exam every 1 to 3 years. After age 52, women should have a CBE every year. Starting at age 67, women should consider having a mammogram (breast X-ray test) every year. Women who have a family history of breast cancer should talk to their health care provider about genetic screening. Women at a high risk of breast cancer should talk to their health care providers about having an MRI and a mammogram every year. Breast cancer gene (BRCA)-related cancer risk assessment is recommended for women who have family members with BRCA-related cancers. BRCA-related cancers include breast, ovarian, tubal, and peritoneal cancers.  Having family members with these cancers may be associated with an increased risk for harmful changes (mutations) in the breast cancer genes BRCA1 and BRCA2. Results of the assessment will determine the need for genetic counseling and BRCA1 and BRCA2 testing. Your health care provider may recommend that you be screened regularly for cancer of the pelvic organs (ovaries, uterus, and vagina). This screening involves a pelvic examination, including checking for microscopic changes to the surface of your cervix (Pap test). You may be encouraged to have this screening done every 3 years, beginning at age 59. For women ages 86-65, health care providers may recommend pelvic exams and Pap testing every 3 years, or they may recommend the Pap and pelvic exam, combined with testing for human papilloma virus (HPV), every 5 years. Some types of HPV increase your risk of cervical cancer. Testing for HPV may also be done on women of any age with unclear Pap test results. Other health care providers may not recommend any screening for nonpregnant women who are considered low risk for pelvic cancer and who do not have symptoms. Ask your health care provider if a screening pelvic exam is right for you. If you have had past treatment for cervical cancer or a condition that could lead to cancer, you need Pap tests and screening for cancer for at least 20 years after your treatment. If Pap tests have been discontinued, your risk factors (such as having a new sexual partner) need to be reassessed to determine if screening should resume. Some women have medical problems that increase the chance of getting cervical cancer. In these cases, your health care provider may recommend more frequent screening and Pap tests. Colorectal cancer can be detected and often prevented. Most routine colorectal cancer screening begins at the age of 73 years and continues through age 30 years. However, your health care provider may recommend screening  at an earlier age if you have risk factors for colon cancer. On a yearly basis, your health care provider may provide home test kits to check for hidden blood in the stool. Use of a small camera at the end of a tube, to directly examine the colon (sigmoidoscopy or colonoscopy), can detect the earliest forms of colorectal cancer. Talk to your health care provider about this at age 80, when routine screening begins. Direct exam of the colon should be repeated every 5-10 years through age 54 years, unless early forms of precancerous polyps or small growths are found. People who are at an increased risk for hepatitis B should be screened for this virus. You are considered at high risk for hepatitis B if: You were born in a country  where hepatitis B occurs often. Talk with your health care provider about which countries are considered high risk. Your parents were born in a high-risk country and you have not received a shot to protect against hepatitis B (hepatitis B vaccine). You have HIV or AIDS. You use needles to inject street drugs. You live with, or have sex with, someone who has hepatitis B. You get hemodialysis treatment. You take certain medicines for conditions like cancer, organ transplantation, and autoimmune conditions. Hepatitis C blood testing is recommended for all people born from [birth year] through 1965 and any individual with known risks for hepatitis C. Practice safe sex. Use condoms and avoid high-risk sexual practices to reduce the spread of sexually transmitted infections (STIs). STIs include gonorrhea, chlamydia, syphilis, trichomonas, herpes, HPV, and human immunodeficiency virus (HIV). Herpes, HIV, and HPV are viral illnesses that have no cure. They can result in disability, cancer, and death. You should be screened for sexually transmitted illnesses (STIs) including gonorrhea and chlamydia if: You are sexually active and are younger than 24 years. You are older than 24 years and your  health care provider tells you that you are at risk for this type of infection. Your sexual activity has changed since you were last screened and you are at an increased risk for chlamydia or gonorrhea. Ask your health care provider if you are at risk. If you are at risk of being infected with HIV, it is recommended that you take a prescription medicine daily to prevent HIV infection. This is called preexposure prophylaxis (PrEP). You are considered at risk if: You are sexually active and do not regularly use condoms or know the HIV status of your partner(s). You take drugs by injection. You are sexually active with a partner who has HIV. Talk with your health care provider about whether you are at high risk of being infected with HIV. If you choose to begin PrEP, you should first be tested for HIV. You should then be tested every 3 months for as long as you are taking PrEP. Osteoporosis is a disease in which the bones lose minerals and strength with aging. This can result in serious bone fractures or breaks. The risk of osteoporosis can be identified using a bone density scan. Women ages 51 years and over and women at risk for fractures or osteoporosis should discuss screening with their health care providers. Ask your health care provider whether you should take a calcium supplement or vitamin D to reduce the rate of osteoporosis. Menopause can be associated with physical symptoms and risks. Hormone replacement therapy is available to decrease symptoms and risks. You should talk to your health care provider about whether hormone replacement therapy is right for you. Use sunscreen. Apply sunscreen liberally and repeatedly throughout the day. You should seek shade when your shadow is shorter than you. Protect yourself by wearing long sleeves, pants, a wide-brimmed hat, and sunglasses year round, whenever you are outdoors. Once a month, do a whole body skin exam, using a mirror to look at the skin on your  back. Tell your health care provider of new moles, moles that have irregular borders, moles that are larger than a pencil eraser, or moles that have changed in shape or color. Stay current with required vaccines (immunizations). Influenza vaccine. All adults should be immunized every year. Tetanus, diphtheria, and acellular pertussis (Td, Tdap) vaccine. Pregnant women should receive 1 dose of Tdap vaccine during each pregnancy. The dose should be obtained regardless of the length of  time since the last dose. Immunization is preferred during the 27th-36th week of gestation. An adult who has not previously received Tdap or who does not know her vaccine status should receive 1 dose of Tdap. This initial dose should be followed by tetanus and diphtheria toxoids (Td) booster doses every 10 years. Adults with an unknown or incomplete history of completing a 3-dose immunization series with Td-containing vaccines should begin or complete a primary immunization series including a Tdap dose. Adults should receive a Td booster every 10 years. Varicella vaccine. An adult without evidence of immunity to varicella should receive 2 doses or a second dose if she has previously received 1 dose. Pregnant females who do not have evidence of immunity should receive the first dose after pregnancy. This first dose should be obtained before leaving the health care facility. The second dose should be obtained 4-8 weeks after the first dose. Human papillomavirus (HPV) vaccine. Females aged 13-26 years who have not received the vaccine previously should obtain the 3-dose series. The vaccine is not recommended for use in pregnant females. However, pregnancy testing is not needed before receiving a dose. If a female is found to be pregnant after receiving a dose, no treatment is needed. In that case, the remaining doses should be delayed until after the pregnancy. Immunization is recommended for any person with an immunocompromised  condition through the age of 65 years if she did not get any or all doses earlier. During the 3-dose series, the second dose should be obtained 4-8 weeks after the first dose. The third dose should be obtained 24 weeks after the first dose and 16 weeks after the second dose. Zoster vaccine. One dose is recommended for adults aged 17 years or older unless certain conditions are present. Measles, mumps, and rubella (MMR) vaccine. Adults born before [birth year] generally are considered immune to measles and mumps. Adults born in [birth year] or later should have 1 or more doses of MMR vaccine unless there is a contraindication to the vaccine or there is laboratory evidence of immunity to each of the three diseases. A routine second dose of MMR vaccine should be obtained at least 28 days after the first dose for students attending postsecondary schools, health care workers, or international travelers. People who received inactivated measles vaccine or an unknown type of measles vaccine during 1963-1967 should receive 2 doses of MMR vaccine. People who received inactivated mumps vaccine or an unknown type of mumps vaccine before 1979 and are at high risk for mumps infection should consider immunization with 2 doses of MMR vaccine. For females of childbearing age, rubella immunity should be determined. If there is no evidence of immunity, females who are not pregnant should be vaccinated. If there is no evidence of immunity, females who are pregnant should delay immunization until after pregnancy. Unvaccinated health care workers born before [birth year] who lack laboratory evidence of measles, mumps, or rubella immunity or laboratory confirmation of disease should consider measles and mumps immunization with 2 doses of MMR vaccine or rubella immunization with 1 dose of MMR vaccine. Pneumococcal 13-valent conjugate (PCV13) vaccine. When indicated, a person who is uncertain of his immunization history and has no record of immunization should  receive the PCV13 vaccine. All adults 26 years of age and older should receive this vaccine. An adult aged 64 years or older who has certain medical conditions and has not been previously immunized should receive 1 dose of PCV13 vaccine. This PCV13 should be followed with a dose of  pneumococcal polysaccharide (PPSV23) vaccine. Adults who are at high risk for pneumococcal disease should obtain the PPSV23 vaccine at least 8 weeks after the dose of PCV13 vaccine. Adults older than 52 years of age who have normal immune system function should obtain the PPSV23 vaccine dose at least 1 year after the dose of PCV13 vaccine. Pneumococcal polysaccharide (PPSV23) vaccine. When PCV13 is also indicated, PCV13 should be obtained first. All adults aged 53 years and older should be immunized. An adult younger than age 61 years who has certain medical conditions should be immunized. Any person who resides in a nursing home or long-term care facility should be immunized. An adult smoker should be immunized. People with an immunocompromised condition and certain other conditions should receive both PCV13 and PPSV23 vaccines. People with human immunodeficiency virus (HIV) infection should be immunized as soon as possible after diagnosis. Immunization during chemotherapy or radiation therapy should be avoided. Routine use of PPSV23 vaccine is not recommended for American Indians, [place] Natives, or people younger than 65 years unless there are medical conditions that require PPSV23 vaccine. When indicated, people who have unknown immunization and have no record of immunization should receive PPSV23 vaccine. One-time revaccination 5 years after the first dose of PPSV23 is recommended for people aged 19-64 years who have chronic kidney failure, nephrotic syndrome, asplenia, or immunocompromised conditions. People who received 1-2 doses of PPSV23 before age 86 years should receive another dose of PPSV23 vaccine at age 26 years or later  if at least 5 years have passed since the previous dose. Doses of PPSV23 are not needed for people immunized with PPSV23 at or after age 42 years. Meningococcal vaccine. Adults with asplenia or persistent complement component deficiencies should receive 2 doses of quadrivalent meningococcal conjugate (MenACWY-D) vaccine. The doses should be obtained at least 2 months apart. Microbiologists working with certain meningococcal bacteria, [place] recruits, people at risk during an outbreak, and people who travel to or live in countries with a high rate of meningitis should be immunized. A first-year college student up through age 54 years who is living in a residence hall should receive a dose if she did not receive a dose on or after her 16th birthday. Adults who have certain high-risk conditions should receive one or more doses of vaccine. Hepatitis A vaccine. Adults who wish to be protected from this disease, have certain high-risk conditions, work with hepatitis A-infected animals, work in hepatitis A research labs, or travel to or work in countries with a high rate of hepatitis A should be immunized. Adults who were previously unvaccinated and who anticipate close contact with an international adoptee during the first 60 days after arrival in the [place] from a country with a high rate of hepatitis A should be immunized. Hepatitis B vaccine. Adults who wish to be protected from this disease, have certain high-risk conditions, may be exposed to blood or other infectious body fluids, are household contacts or sex partners of hepatitis B positive people, are clients or workers in certain care facilities, or travel to or work in countries with a high rate of hepatitis B should be immunized. Haemophilus influenzae type b (Hib) vaccine. A previously unvaccinated person with asplenia or sickle cell disease or having a scheduled splenectomy should receive 1 dose of Hib vaccine. Regardless of previous  immunization, a recipient of a hematopoietic stem cell transplant should receive a 3-dose series 6-12 months after her successful transplant. Hib vaccine is not recommended for adults with HIV infection. Preventive  Services / Frequency Ages 15 to 39 years Blood pressure check.** / Every 3-5 years. Lipid and cholesterol check.** / Every 5 years beginning at age 22. Clinical breast exam.** / Every 3 years for women in their 23s and 82s. BRCA-related cancer risk assessment.** / For women who have family members with a BRCA-related cancer (breast, ovarian, tubal, or peritoneal cancers). Pap test.** / Every 2 years from ages 38 through 36. Every 3 years starting at age 4 through age 13 or 39 with a history of 3 consecutive normal Pap tests. HPV screening.** / Every 3 years from ages 39 through ages 9 to 54 with a history of 3 consecutive normal Pap tests. Hepatitis C blood test.** / For any individual with known risks for hepatitis C. Skin self-exam. / Monthly. Influenza vaccine. / Every year. Tetanus, diphtheria, and acellular pertussis (Tdap, Td) vaccine.** / Consult your health care provider. Pregnant women should receive 1 dose of Tdap vaccine during each pregnancy. 1 dose of Td every 10 years. Varicella vaccine.** / Consult your health care provider. Pregnant females who do not have evidence of immunity should receive the first dose after pregnancy. HPV vaccine. / 3 doses over 6 months, if 70 and younger. The vaccine is not recommended for use in pregnant females. However, pregnancy testing is not needed before receiving a dose. Measles, mumps, rubella (MMR) vaccine.** / You need at least 1 dose of MMR if you were born in 1957 or later. You may also need a 2nd dose. For females of childbearing age, rubella immunity should be determined. If there is no evidence of immunity, females who are not pregnant should be vaccinated. If there is no evidence of immunity, females who are pregnant should delay  immunization until after pregnancy. Pneumococcal 13-valent conjugate (PCV13) vaccine.** / Consult your health care provider. Pneumococcal polysaccharide (PPSV23) vaccine.** / 1 to 2 doses if you smoke cigarettes or if you have certain conditions. Meningococcal vaccine.** / 1 dose if you are age 22 to 61 years and a Market researcher living in a residence hall, or have one of several medical conditions, you need to get vaccinated against meningococcal disease. You may also need additional booster doses. Hepatitis A vaccine.** / Consult your health care provider. Hepatitis B vaccine.** / Consult your health care provider. Haemophilus influenzae type b (Hib) vaccine.** / Consult your health care provider. Ages 33 to 58 years Blood pressure check.** / Every year. Lipid and cholesterol check.** / Every 5 years beginning at age 56 years. Lung cancer screening. / Every year if you are aged 65-80 years and have a 30-pack-year history of smoking and currently smoke or have quit within the past 15 years. Yearly screening is stopped once you have quit smoking for at least 15 years or develop a health problem that would prevent you from having lung cancer treatment. Clinical breast exam.** / Every year after age 85 years. BRCA-related cancer risk assessment.** / For women who have family members with a BRCA-related cancer (breast, ovarian, tubal, or peritoneal cancers). Mammogram.** / Every year beginning at age 72 years and continuing for as long as you are in good health. Consult with your health care provider. Pap test.** / Every 3 years starting at age 65 years through age 34 or 29 years with a history of 3 consecutive normal Pap tests. HPV screening.** / Every 3 years from ages 42 years through ages 66 to 93 years with a history of 3 consecutive normal Pap tests. Fecal occult blood test (  FOBT) of stool. / Every year beginning at age 39 years and continuing until age 28 years. You may not need to  do this test if you get a colonoscopy every 10 years. Flexible sigmoidoscopy or colonoscopy.** / Every 5 years for a flexible sigmoidoscopy or every 10 years for a colonoscopy beginning at age 72 years and continuing until age 54 years. Hepatitis C blood test.** / For all people born from [birth year] through 1967 and any individual with known risks for hepatitis C. Skin self-exam. / Monthly. Influenza vaccine. / Every year. Tetanus, diphtheria, and acellular pertussis (Tdap/Td) vaccine.** / Consult your health care provider. Pregnant women should receive 1 dose of Tdap vaccine during each pregnancy. 1 dose of Td every 10 years. Varicella vaccine.** / Consult your health care provider. Pregnant females who do not have evidence of immunity should receive the first dose after pregnancy. Zoster vaccine.** / 1 dose for adults aged 23 years or older. Measles, mumps, rubella (MMR) vaccine.** / You need at least 1 dose of MMR if you were born in 1957 or later. You may also need a second dose. For females of childbearing age, rubella immunity should be determined. If there is no evidence of immunity, females who are not pregnant should be vaccinated. If there is no evidence of immunity, females who are pregnant should delay immunization until after pregnancy. Pneumococcal 13-valent conjugate (PCV13) vaccine.** / Consult your health care provider. Pneumococcal polysaccharide (PPSV23) vaccine.** / 1 to 2 doses if you smoke cigarettes or if you have certain conditions. Meningococcal vaccine.** / Consult your health care provider. Hepatitis A vaccine.** / Consult your health care provider. Hepatitis B vaccine.** / Consult your health care provider. Haemophilus influenzae type b (Hib) vaccine.** / Consult your health care provider. Ages 62 years and over Blood pressure check.** / Every year. Lipid and cholesterol check.** / Every 5 years beginning at age 84 years. Lung cancer screening. / Every year if you are aged  34-80 years and have a 30-pack-year history of smoking and currently smoke or have quit within the past 15 years. Yearly screening is stopped once you have quit smoking for at least 15 years or develop a health problem that would prevent you from having lung cancer treatment. Clinical breast exam.** / Every year after age 21 years. BRCA-related cancer risk assessment.** / For women who have family members with a BRCA-related cancer (breast, ovarian, tubal, or peritoneal cancers). Mammogram.** / Every year beginning at age 59 years and continuing for as long as you are in good health. Consult with your health care provider. Pap test.** / Every 3 years starting at age 61 years through age 63 or 27 years with 3 consecutive normal Pap tests. Testing can be stopped between 65 and 70 years with 3 consecutive normal Pap tests and no abnormal Pap or HPV tests in the past 10 years. HPV screening.** / Every 3 years from ages 45 years through ages 67 or 83 years with a history of 3 consecutive normal Pap tests. Testing can be stopped between 65 and 70 years with 3 consecutive normal Pap tests and no abnormal Pap or HPV tests in the past 10 years. Fecal occult blood test (FOBT) of stool. / Every year beginning at age 33 years and continuing until age 24 years. You may not need to do this test if you get a colonoscopy every 10 years. Flexible sigmoidoscopy or colonoscopy.** / Every 5 years for a flexible sigmoidoscopy or every 10 years for a colonoscopy  beginning at age 36 years and continuing until age 32 years. Hepatitis C blood test.** / For all people born from [birth year] through 1967 and any individual with known risks for hepatitis C. Osteoporosis screening.** / A one-time screening for women ages 41 years and over and women at risk for fractures or osteoporosis. Skin self-exam. / Monthly. Influenza vaccine. / Every year. Tetanus, diphtheria, and acellular pertussis (Tdap/Td) vaccine.** / 1 dose of Td every 10  years. Varicella vaccine.** / Consult your health care provider. Zoster vaccine.** / 1 dose for adults aged 82 years or older. Pneumococcal 13-valent conjugate (PCV13) vaccine.** / Consult your health care provider. Pneumococcal polysaccharide (PPSV23) vaccine.** / 1 dose for all adults aged 8 years and older. Meningococcal vaccine.** / Consult your health care provider. Hepatitis A vaccine.** / Consult your health care provider. Hepatitis B vaccine.** / Consult your health care provider. Haemophilus influenzae type b (Hib) vaccine.** / Consult your health care provider. ** Family history and personal history of risk and conditions may change your health care provider's recommendations.   This information is not intended to replace advice given to you by your health care provider. Make sure you discuss any questions you have with your health care provider.   Document Released: 12/01/2001 Document Revised: 10/26/2014 Document Reviewed: 03/02/2011 Elsevier Interactive Patient Education [employer].

## 2016-02-17 ENCOUNTER — Encounter: Payer: Self-pay | Admitting: Family Medicine

## 2016-10-21 ENCOUNTER — Ambulatory Visit (INDEPENDENT_AMBULATORY_CARE_PROVIDER_SITE_OTHER): Payer: BLUE CROSS/BLUE SHIELD | Admitting: Medical

## 2016-10-21 ENCOUNTER — Encounter: Payer: Self-pay | Admitting: Medical

## 2016-10-21 ENCOUNTER — Telehealth: Payer: Self-pay

## 2016-10-21 VITALS — BP 128/84 | HR 60 | Temp 98.1°F | Resp 16 | Ht 66.0 in | Wt 133.1 lb

## 2016-10-21 DIAGNOSIS — J111 Influenza due to unidentified influenza virus with other respiratory manifestations: Secondary | ICD-10-CM | POA: Diagnosis not present

## 2016-10-21 DIAGNOSIS — R059 Cough, unspecified: Secondary | ICD-10-CM

## 2016-10-21 DIAGNOSIS — R05 Cough: Secondary | ICD-10-CM | POA: Diagnosis not present

## 2016-10-21 DIAGNOSIS — J0100 Acute maxillary sinusitis, unspecified: Secondary | ICD-10-CM | POA: Diagnosis not present

## 2016-10-21 DIAGNOSIS — K648 Other hemorrhoids: Secondary | ICD-10-CM | POA: Diagnosis not present

## 2016-10-21 MED ORDER — DOXYCYCLINE HYCLATE 100 MG PO TABS: 100.0000 mg | ORAL_TABLET | Freq: Two times a day (BID) | ORAL | 0 refills | Status: DC

## 2016-10-21 MED ORDER — HYDROCORTISONE ACETATE 30 MG RE SUPP: 1.0000 | Freq: Every evening | RECTAL | 1 refills | Status: DC | PRN

## 2016-10-21 MED ORDER — HYDROCODONE-HOMATROPINE 5-1.5 MG/5ML PO SYRP: 5.0000 mL | ORAL_SOLUTION | Freq: Three times a day (TID) | ORAL | 0 refills | Status: DC | PRN

## 2016-10-21 MED ORDER — FLUTICASONE PROPIONATE 50 MCG/ACT NA SUSP: 2.0000 | Freq: Every day | NASAL | 1 refills | Status: DC

## 2016-10-21 MED ORDER — ALBUTEROL SULFATE HFA 108 (90 BASE) MCG/ACT IN AERS: 2.0000 | INHALATION_SPRAY | Freq: Four times a day (QID) | RESPIRATORY_TRACT | 0 refills | Status: DC | PRN

## 2016-10-21 MED FILL — HYDROCODONE-HOMATROPINE SOL: 5-1.5 | 8 days supply | Qty: 120 | Fill #0

## 2016-10-21 MED FILL — DOXYCYCLINE HYCLATE 100 MG: 100 | 10 days supply | Qty: 20 | Fill #0

## 2016-10-21 NOTE — Progress Notes (Signed)
Subjective:    Patient ID: Nicole Lynch, female    DOB: 05/30/1966, 51 y.o.   MRN: [id number]  HPI   Pt in for evaluation. She test positive for flu on December 26th. Pt symptoms body aches fever, chills, sweat and cough started on 24 th. Pt went to [facility] and flu test was positive but no treatment. Other family had milder symptoms. Pt has rested and slept a lot. Pt states her energy is picking back up. Pt is bringing up some mucous when she coughs. No sinus pain. Some left ear pain but seeing specialist for fluid in ear.(pt is on dyzazide to help with fluid). Pt had mild st as well.  When pt blows nose colored mucous.  Pt cough is moderate. Occasional constant cough.  Pt has not been wheezing.  Pt needs refill of suppository hydrocortisone. Pt has done well with the 30 mg dose. Hx of hemorrhoid.   Review of Systems  Constitutional: Negative for chills, fatigue and fever.  HENT: Positive for congestion, ear pain, sinus pain and sinus pressure. Negative for hearing loss and postnasal drip.   Respiratory: Negative for cough, chest tightness, shortness of breath and wheezing.   Cardiovascular: Negative for chest pain and palpitations.  Gastrointestinal: Negative for abdominal pain.  Musculoskeletal: Negative for back pain.  Skin: Negative for rash.  Neurological: Negative for dizziness, speech difficulty, weakness, light-headedness, numbness and headaches.  Hematological: Negative for adenopathy. Does not bruise/bleed easily.  Psychiatric/Behavioral: Negative for behavioral problems and confusion.     Past Medical History:  Diagnosis Date  . Allergy    seasonal  . Chicken pox as a child  . Depression 3   postpartum   . Migraine   . Preventative health care 02/03/2016  . Sinusitis acute 06/29/2011  . SOM (serous otitis media) 02/03/2016     Social History   Social History  . Marital status: Married    Spouse name: N/A  . Number of children: 2  . Years of education: N/A    Occupational History  . Conservation officer, [employer]     Social History Main Topics  . Smoking status: Never Smoker  . Smokeless tobacco: Never Used  . Alcohol use No     Comment:    . Drug use: No  . Sexual activity: Not on file   Other Topics Concern  . Not on file   Social History Narrative  . No narrative on file    Past Surgical History:  Procedure Laterality Date  . CESAREAN SECTION  2-05, 10-07   X 2  . deviated septum repair  1982    Family History  Problem Relation Age of Onset  . Vision loss Mother     macular degeneration  . Heart disease Father     afib on blood thinner  . Cancer Father     prostate cancer    Allergies  Allergen Reactions  . Latex Itching    Current Outpatient Prescriptions on File Prior to Visit  Medication Sig Dispense Refill  . albuterol (PROVENTIL HFA;VENTOLIN HFA) 108 (90 BASE) MCG/ACT inhaler Inhale 2 puffs into the lungs every 6 (six) hours as needed for wheezing or shortness of breath. Reported on 02/03/2016    . co-enzyme Q-10 30 MG capsule Take 30 mg by mouth daily. Reported on 02/03/2016    . HYDROCORTISONE ACE, RECTAL, 30 MG SUPP Place 1 suppository (30 mg total) rectally at bedtime as needed. 28 each 1  . Misc Natural Products (GLUCOSAMINE  CHONDROITIN MSM PO) Take by mouth daily. Reported on 02/03/2016    . OMEGA 3 1000 MG CAPS Take 1 capsule by mouth daily. Reported on 02/03/2016    . ondansetron (ZOFRAN) 4 MG tablet Take 1 tablet (4 mg total) by mouth every 8 (eight) hours as needed for nausea or vomiting. 20 tablet 0  . SUMAtriptan (IMITREX) 100 MG tablet Take 1 tablet (100 mg total) by mouth every 2 (two) hours as needed for migraine. 10 tablet 2  . triamterene-hydrochlorothiazide (DYAZIDE) 37.5-25 MG capsule Take 1 capsule by mouth daily as needed. Reported on 02/03/2016  1   No current facility-administered medications on file prior to visit.     BP 128/84 (BP Location: Right Arm, Patient Position: Sitting, Cuff Size: Normal)    Pulse 60   Temp 98.1 F (36.7 C) (Oral)   Resp 16   Ht 5\' 6"  (1.676 m)   Wt 133 lb 2 oz (60.4 kg)   LMP 10/14/2016   SpO2 99%   BMI 21.49 kg/m       Objective:   Physical Exam  General  Mental Status - Alert. General Appearance - Well groomed. Not in acute distress.(on exam moderate to severe cough)  Skin Pt has some scattered appearance of inflammed follicles. No vesicles. Area is on both sides lumbar spine.  HEENT Head- Normal. Ear Auditory Canal - Left- Normal. Right - Normal.Tympanic Membrane- Left- Normal (but on insertion otoscope mild pain). Right- Normal. Eye Sclera/Conjunctiva- Left- Normal. Right- Normal. Nose & Sinuses Nasal Mucosa- Left-  Boggy and Congested. Right-  Boggy and  Congested.Bilateral maxillary and frontal sinus pressure. Mouth & Throat Lips: Upper Lip- Normal: no dryness, cracking, pallor, cyanosis, or vesicular eruption. Lower Lip-Normal: no dryness, cracking, pallor, cyanosis or vesicular eruption. Buccal Mucosa- Bilateral- No Aphthous ulcers. Oropharynx- No Discharge or Erythema. Tonsils: Characteristics- Bilateral- No Erythema or Congestion. Size/Enlargement- Bilateral- No enlargement. Discharge- bilateral-None.  Neck Neck- Supple. No Masses.   Chest and Lung Exam Auscultation: Breath Sounds:-Clear even and unlabored.  Cardiovascular Auscultation:Rythm- Regular, rate and rhythm. Murmurs & Other Heart Sounds:Ausculatation of the heart reveal- No Murmurs.  Lymphatic Head & Neck General Head & Neck Lymphatics: Bilateral: Description- No Localized lymphadenopathy.        Assessment & Plan:  You appear to have a sinus infection and bronchitis(post flu/secondary infections). I am prescribing  doxycycline antibiotic for the infection. To help with the nasal congestion I prescribed nasal steroid flonase. For your associated cough, I prescribed cough medicine hycodan.  Will make albuterol inhaler available to use if needed for  wheezing.  For hx of hemorrhoids refilling you suppository.  Rest, hydrate, tylenol for fever.  Follow up in 7 days or as needed.  Nicole Lynch, Percell Miller, PA-C

## 2016-10-21 NOTE — Telephone Encounter (Signed)
PA initiated via Covermymeds; KEY: [id number]. Received real-time approval: Approvedtoday  [id number] Name:ST: NP Nasal Corticosteroids [Beconase AQ, Flonase, Nasarel, Nasacort AQ, Nasonex, Omnaris, QNASL, Rhinocort Aqua, Veramyst, [place], Zetonna] - [employer] National and Preferred and Essential and Traditional;Status:Approved;Coverage Start Date:10/21/2016;Coverage End Date:10/21/2017

## 2016-10-21 NOTE — Patient Instructions (Addendum)
You appear to have a sinus infection and bronchitis(post flu/secondary infections). I am prescribing  doxycycline antibiotic for the infection. To help with the nasal congestion I prescribed nasal steroid flonase. For your associated cough, I prescribed cough medicine hycodan.  Will make albuterol inhaler available to use if needed for wheezing.  For hx of hemorrhoids refilling you suppository.  Moisturize skin and spot treat itchy areas with otc hydorcortisone. If rash persists or worsens please let us know.  Rest, hydrate, tylenol for fever.  Follow up in 7 days or as needed.

## 2016-10-21 NOTE — Progress Notes (Signed)
Pre visit review using our clinic review tool, if applicable. No additional management support is needed unless otherwise documented below in the visit note/SLS  

## 2017-02-09 ENCOUNTER — Ambulatory Visit (INDEPENDENT_AMBULATORY_CARE_PROVIDER_SITE_OTHER): Payer: BLUE CROSS/BLUE SHIELD | Admitting: Family Medicine

## 2017-02-09 ENCOUNTER — Encounter: Payer: Self-pay | Admitting: Family Medicine

## 2017-02-09 VITALS — BP 108/75 | HR 61 | Temp 98.0°F | Ht 66.0 in | Wt 130.2 lb

## 2017-02-09 DIAGNOSIS — G43709 Chronic migraine without aura, not intractable, without status migrainosus: Secondary | ICD-10-CM | POA: Diagnosis not present

## 2017-02-09 DIAGNOSIS — S40261A Insect bite (nonvenomous) of right shoulder, initial encounter: Secondary | ICD-10-CM

## 2017-02-09 DIAGNOSIS — W57XXXA Bitten or stung by nonvenomous insect and other nonvenomous arthropods, initial encounter: Secondary | ICD-10-CM | POA: Diagnosis not present

## 2017-02-09 DIAGNOSIS — R002 Palpitations: Secondary | ICD-10-CM

## 2017-02-09 DIAGNOSIS — S40269A Insect bite (nonvenomous) of unspecified shoulder, initial encounter: Secondary | ICD-10-CM

## 2017-02-09 DIAGNOSIS — Z0000 Encounter for general adult medical examination without abnormal findings: Secondary | ICD-10-CM

## 2017-02-09 HISTORY — DX: Insect bite (nonvenomous) of unspecified shoulder, initial encounter: S40.269A

## 2017-02-09 HISTORY — DX: Bitten or stung by nonvenomous insect and other nonvenomous arthropods, initial encounter: W57.XXXA

## 2017-02-09 LAB — LIPID PANEL
CHOLESTEROL: 160 mg/dL (ref 0–200)
HDL: 65.2 mg/dL (ref >39.00)
LDL Cholesterol: 81 mg/dL (ref 0–99)
NonHDL: 94.44
TRIGLYCERIDES: 65 mg/dL (ref 0.0–149.0)
Total CHOL/HDL Ratio: 2
VLDL: 13 mg/dL (ref 0.0–40.0)

## 2017-02-09 LAB — COMPREHENSIVE METABOLIC PANEL
ALBUMIN: 4.5 g/dL (ref 3.5–5.2)
ALK PHOS: 52 U/L (ref 39–117)
ALT: 13 U/L (ref 0–35)
AST: 16 U/L (ref 0–37)
BILIRUBIN TOTAL: 0.9 mg/dL (ref 0.2–1.2)
BUN: 18 mg/dL (ref 6–23)
CALCIUM: 9.9 mg/dL (ref 8.4–10.5)
CO2: 30 mEq/L (ref 19–32)
Chloride: 103 mEq/L (ref 96–112)
Creatinine, Ser: 0.89 mg/dL (ref 0.40–1.20)
GFR: 70.57 mL/min (ref >60.00)
Glucose, Bld: 80 mg/dL (ref 70–99)
Potassium: 3.7 mEq/L (ref 3.5–5.1)
Sodium: 138 mEq/L (ref 135–145)
TOTAL PROTEIN: 7.1 g/dL (ref 6.0–8.3)

## 2017-02-09 LAB — CBC
HCT: 41.6 % (ref 36.0–46.0)
Hemoglobin: 13.9 g/dL (ref 12.0–15.0)
MCHC: 33.5 g/dL (ref 30.0–36.0)
MCV: 96.7 fl (ref 78.0–100.0)
PLATELETS: 202 10*3/uL (ref 150.0–400.0)
RBC: 4.3 Mil/uL (ref 3.87–5.11)
RDW: 12.8 % (ref 11.5–15.5)
WBC: 5.5 10*3/uL (ref 4.0–10.5)

## 2017-02-09 LAB — TSH: TSH: 1.43 u[IU]/mL (ref 0.35–4.50)

## 2017-02-09 MED ORDER — SUMATRIPTAN SUCCINATE 100 MG PO TABS: 100.0000 mg | ORAL_TABLET | ORAL | 5 refills | Status: DC | PRN

## 2017-02-09 NOTE — Assessment & Plan Note (Signed)
With left sided facial neuropathy. These have been worse since her concussion in past couple of years. Her headaches come more frequently, are more painful and they are lasting longer since concussion. 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 She believes she has had the meniere's symptoms and more memory trouble since then. Has previously seen a craniosacral specialist. Consider neurology referral if continues to worsen.+

## 2017-02-09 NOTE — Progress Notes (Signed)
Pre visit review using our clinic review tool, if applicable. No additional management support is needed unless otherwise documented below in the visit note. 

## 2017-02-09 NOTE — Assessment & Plan Note (Signed)
Check lyme, RMSF and ehrlichia due to rash, headache and fatigue. For pruritus on skin take Cetirizine 10 mg bid, Ranitidine 75 mg po bid and topical Witch Hazel and Sarna lotion. Report if worse

## 2017-02-09 NOTE — Assessment & Plan Note (Signed)
Patient encouraged to maintain heart healthy diet, regular exercise, adequate sleep. Consider daily probiotics. Take medications as prescribed. Labs ordered  

## 2017-02-09 NOTE — Progress Notes (Signed)
Patient ID: Nicole Lynch, female   DOB: 06-16-1966, 51 y.o.   MRN: [id number]   Subjective:  I acted as a Education administrator for Penni Homans, [place], [place]   Patient ID: Nicole Lynch, female    DOB: [birth year], 51 y.o.   MRN: [id number]  Chief Complaint  Patient presents with  . Annual Exam  . Depression  . Migraine    Depression         This is a chronic problem.The problem is unchanged.  Associated symptoms include headaches. Migraine   This is a chronic problem. The problem has been unchanged. The quality of the pain is described as dull and aching. Associated symptoms include abdominal pain, coughing, hearing loss and tinnitus. Pertinent negatives include no back pain, blurred vision, ear pain, fever or vomiting.    Patient is in today for an annual examination. 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 Also states that she is still issue with migraines. Has been suffering with dry sinuses, feeling bloated after eating. Also sporadic menstrual cycles with some memory concerns.  Patient has a Hx of depression, migraines. Patient has no additional acute concerns noted at this time. She feels her tinnitis, hearing loss and headaches on the left since her concussion. No nausea, vomiting or scotomata. She accepts the premise that her insomnia, fatigue, bloating, memory concerns and headache flares could be partially related to perimenopause. Recently she went 6 months between cycles. Is exercising regularly and eating a heart healthy diet. Denies CP/palp/SOB/HA/congestion/fevers/GI or GU c/o. Taking meds as prescribed  Patient Care Team: Mosie Lukes, MD as PCP - General (Family Medicine) Paula Compton, MD as Consulting Physician (Obstetrics and Gynecology) Vicie Mutters, MD as Consulting Physician (Otolaryngology) Jari Pigg, MD as Consulting Physician (Dermatology)   Past Medical History:  Diagnosis Date  . Allergy    seasonal  . Chicken pox  as a child  . Depression 05   postpartum   . Meniere disease, left 02/03/2016  . Migraine   . Preventative health care 02/03/2016  . Sinusitis acute 06/29/2011  . SOM (serous otitis media) 02/03/2016  . Tick bite of shoulder 02/09/2017    Past Surgical History:  Procedure Laterality Date  . CESAREAN SECTION  2-05, 10-07   X 2  . deviated septum repair  1982    Family History  Problem Relation Age of Onset  . Vision loss Mother     macular degeneration  . Heart disease Father     afib on blood thinner  . Cancer Father     prostate cancer    Social History   Social History  . Marital status: Married    Spouse name: N/A  . Number of children: 2  . Years of education: N/A   Occupational History  . Conservation officer, [employer]     Social History Main Topics  . Smoking status: Never Smoker  . Smokeless tobacco: Never Used  . Alcohol use No     Comment:    . Drug use: No  . Sexual activity: Not on file   Other Topics Concern  . Not on file   Social History Narrative  . No narrative on file    Outpatient Medications Prior to Visit  Medication Sig Dispense Refill  . albuterol (PROVENTIL HFA;VENTOLIN HFA) 108 (90 Base) MCG/ACT inhaler Inhale 2 puffs into the lungs every 6 (six) hours as needed for wheezing or shortness of breath. 1 Inhaler 0  .  fluticasone (FLONASE) 50 MCG/ACT nasal spray Place 2 sprays into both nostrils daily. 16 g 1  . HYDROCORTISONE ACE, RECTAL, 30 MG SUPP Place 1 suppository (30 mg total) rectally at bedtime as needed. 28 each 1  . Misc Natural Products (GLUCOSAMINE CHONDROITIN MSM PO) Take by mouth daily. Reported on 02/03/2016    . OMEGA 3 1000 MG CAPS Take 1 capsule by mouth daily. Reported on 02/03/2016    . ondansetron (ZOFRAN) 4 MG tablet Take 1 tablet (4 mg total) by mouth every 8 (eight) hours as needed for nausea or vomiting. 20 tablet 0  . triamterene-hydrochlorothiazide (DYAZIDE) 37.5-25 MG capsule Take 1 capsule by mouth daily as needed. Reported on  02/03/2016  1  . SUMAtriptan (IMITREX) 100 MG tablet Take 1 tablet (100 mg total) by mouth every 2 (two) hours as needed for migraine. 10 tablet 2  . co-enzyme Q-10 30 MG capsule Take 30 mg by mouth daily. Reported on 02/03/2016    . doxycycline (VIBRA-TABS) 100 MG tablet Take 1 tablet (100 mg total) by mouth 2 (two) times daily. (Patient not taking: Reported on 02/09/2017) 20 tablet 0  . HYDROcodone-homatropine (HYCODAN) 5-1.5 MG/5ML syrup Take 5 mLs by mouth every 8 (eight) hours as needed for cough. (Patient not taking: Reported on 02/09/2017) 120 mL 0   No facility-administered medications prior to visit.     Allergies  Allergen Reactions  . Latex Itching    Review of Systems  Constitutional: Negative for fever and malaise/fatigue.  HENT: Positive for hearing loss and tinnitus. Negative for congestion, ear discharge and ear pain.   Eyes: Negative for blurred vision.  Respiratory: Positive for cough. Negative for shortness of breath.   Cardiovascular: Negative for chest pain, palpitations and leg swelling.  Gastrointestinal: Positive for abdominal pain and constipation. Negative for vomiting.  Musculoskeletal: Negative for back pain.  Skin: Positive for itching and rash.  Neurological: Positive for headaches. Negative for loss of consciousness.  Psychiatric/Behavioral: Positive for depression.       Objective:    Physical Exam  Constitutional: She is oriented to person, place, and time. She appears well-developed and well-nourished. No distress.  HENT:  Head: Normocephalic and atraumatic.  TMs slight clear fluid behind  Eyes: Conjunctivae are normal.  Neck: Normal range of motion. No thyromegaly present.  Cardiovascular: Normal rate and regular rhythm.   Pulmonary/Chest: Effort normal and breath sounds normal. She has no wheezes.  Abdominal: Soft. Bowel sounds are normal. There is no tenderness.  Genitourinary: Vagina normal and uterus normal. No vaginal discharge found.    Musculoskeletal: She exhibits no edema or deformity.  Neurological: She is alert and oriented to person, place, and time.  Skin: Skin is warm and dry. She is not diaphoretic.  Small, erythematous lesion on right shoulder  Psychiatric: She has a normal mood and affect.    BP 108/75 (BP Location: Left Arm, Patient Position: Sitting, Cuff Size: Normal)   Pulse 61   Temp 98 F (36.7 C) (Oral)   Ht 5\' 6"  (1.676 m)   Wt 130 lb 3.2 oz (59.1 kg)   LMP 12/14/2016 Comment: Priior the Cycle on 12/14/2016, Patient states that she hadn't had a cycle in at least six months.  SpO2 99% Comment: RA  BMI 21.01 kg/m  Wt Readings from Last 3 Encounters:  02/09/17 130 lb 3.2 oz (59.1 kg)  10/21/16 133 lb 2 oz (60.4 kg)  02/03/16 134 lb (60.8 kg)   BP Readings from Last 3 Encounters:  02/09/17 108/75  10/21/16 128/84  02/03/16 112/84     Immunization History  Administered Date(s) Administered  . Influenza Split 10/25/2012  . Influenza Whole 08/20/2011, 09/08/2013  . Influenza,inj,Quad PF,36+ Mos 08/05/2015  . PPD Test 01/01/2014  . Tdap 01/03/2014    Health Maintenance  Topic Date Due  . COLONOSCOPY  02/09/2018 (Originally 04/22/2014)  . MAMMOGRAM  04/01/2017  . INFLUENZA VACCINE  05/19/2017  . PAP SMEAR  04/01/2018  . TETANUS/TDAP  01/04/2024  . Hepatitis C Screening  Completed  . HIV Screening  Completed    Lab Results  Component Value Date   WBC 5.5 02/09/2017   HGB 13.9 02/09/2017   HCT 41.6 02/09/2017   PLT 202.0 02/09/2017   GLUCOSE 80 02/09/2017   CHOL 160 02/09/2017   TRIG 65.0 02/09/2017   HDL 65.20 02/09/2017   LDLCALC 81 02/09/2017   ALT 13 02/09/2017   AST 16 02/09/2017   NA 138 02/09/2017   K 3.7 02/09/2017   CL 103 02/09/2017   CREATININE 0.89 02/09/2017   BUN 18 02/09/2017   CO2 30 02/09/2017   TSH 1.43 02/09/2017    Lab Results  Component Value Date   TSH 1.43 02/09/2017   Lab Results  Component Value Date   WBC 5.5 02/09/2017   HGB 13.9  02/09/2017   HCT 41.6 02/09/2017   MCV 96.7 02/09/2017   PLT 202.0 02/09/2017   Lab Results  Component Value Date   NA 138 02/09/2017   K 3.7 02/09/2017   CO2 30 02/09/2017   GLUCOSE 80 02/09/2017   BUN 18 02/09/2017   CREATININE 0.89 02/09/2017   BILITOT 0.9 02/09/2017   ALKPHOS 52 02/09/2017   AST 16 02/09/2017   ALT 13 02/09/2017   PROT 7.1 02/09/2017   ALBUMIN 4.5 02/09/2017   CALCIUM 9.9 02/09/2017   GFR 70.57 02/09/2017   Lab Results  Component Value Date   CHOL 160 02/09/2017   Lab Results  Component Value Date   HDL 65.20 02/09/2017   Lab Results  Component Value Date   LDLCALC 81 02/09/2017   Lab Results  Component Value Date   TRIG 65.0 02/09/2017   Lab Results  Component Value Date   CHOLHDL 2 02/09/2017   No results found for: HGBA1C       Assessment & Plan:   Problem List Items Addressed This Visit    Migraine    With left sided facial neuropathy. These have been worse since her concussion in past couple of years. Her headaches come more frequently, are more painful and they are lasting longer since concussion. 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 She believes she has had the meniere's symptoms and more memory trouble since then. Has previously seen a craniosacral specialist. Consider neurology referral if continues to worsen.+      Relevant Medications   SUMAtriptan (IMITREX) 100 MG tablet   Other Relevant Orders   CBC (Completed)   Comprehensive metabolic panel (Completed)   TSH (Completed)   Palpitations   Relevant Orders   CBC (Completed)   Comprehensive metabolic panel (Completed)   Lipid panel (Completed)   TSH (Completed)   Preventative health care - Primary    Patient encouraged to maintain heart healthy diet, regular exercise, adequate sleep. Consider daily probiotics. Take medications as prescribed. Labs ordered      Relevant Orders   CBC (Completed)   Comprehensive metabolic panel (Completed)   Lipid panel (Completed)    TSH (Completed)   Tick bite  of shoulder    Check lyme, RMSF and ehrlichia due to rash, headache and fatigue. For pruritus on skin take Cetirizine 10 mg bid, Ranitidine 75 mg po bid and topical Witch Hazel and Sarna lotion. Report if worse      Relevant Orders   Ehrlichia antibody panel      I have discontinued Ms. Herda's co-enzyme Q-10, doxycycline, and HYDROcodone-homatropine. I am also having her maintain her Omega 3, Misc Natural Products (GLUCOSAMINE CHONDROITIN MSM PO), ondansetron, triamterene-hydrochlorothiazide, HYDROCORTISONE ACE (RECTAL), fluticasone, albuterol, and SUMAtriptan.  Meds ordered this encounter  Medications  . SUMAtriptan (IMITREX) 100 MG tablet    Sig: Take 1 tablet (100 mg total) by mouth every 2 (two) hours as needed for migraine.    Dispense:  10 tablet    Refill:  5    CMA served as scribe during this visit. History, Physical and Plan performed by medical provider. Documentation and orders reviewed and attested to.  Penni Homans, MD

## 2017-02-09 NOTE — Patient Instructions (Signed)

## 2017-02-12 LAB — EHRLICHIA ANTIBODY PANEL: E chaffeensis (HGE) Ab, IgG: <1:64 {titer}

## 2017-03-04 LAB — COLOGUARD: Cologuard: NEGATIVE

## 2017-04-26 ENCOUNTER — Telehealth: Payer: Self-pay | Admitting: *Deleted

## 2017-04-26 NOTE — Telephone Encounter (Signed)
Received Medical Records Request from [facility] [[employer] department], forwarded to provider/SLS

## 2017-05-21 ENCOUNTER — Encounter: Payer: Self-pay | Admitting: Family Medicine

## 2017-07-12 LAB — HM MAMMOGRAPHY

## 2017-08-03 ENCOUNTER — Encounter: Payer: Self-pay | Admitting: Family Medicine

## 2017-08-16 ENCOUNTER — Encounter: Payer: Self-pay | Admitting: Family Medicine

## 2017-08-16 ENCOUNTER — Ambulatory Visit (INDEPENDENT_AMBULATORY_CARE_PROVIDER_SITE_OTHER): Payer: BLUE CROSS/BLUE SHIELD | Admitting: Family Medicine

## 2017-08-16 DIAGNOSIS — H8102 Meniere's disease, left ear: Secondary | ICD-10-CM

## 2017-08-16 DIAGNOSIS — M79671 Pain in right foot: Secondary | ICD-10-CM | POA: Insufficient documentation

## 2017-08-16 DIAGNOSIS — R140 Abdominal distension (gaseous): Secondary | ICD-10-CM

## 2017-08-16 DIAGNOSIS — M545 Low back pain, unspecified: Secondary | ICD-10-CM | POA: Insufficient documentation

## 2017-08-16 HISTORY — DX: Abdominal distension (gaseous): R14.0

## 2017-08-16 MED ORDER — SUMATRIPTAN SUCCINATE 100 MG PO TABS: 100.0000 mg | ORAL_TABLET | ORAL | 5 refills | Status: DC | PRN

## 2017-08-16 NOTE — Assessment & Plan Note (Signed)
Twisted ankle with an inversion injury during a fall. Her lateral ankle is better but she still has pain over base of 5 th metatarsal when she rests that foot on other foot at night. Is improving. She declines xray for now but will call if it worsens

## 2017-08-16 NOTE — Assessment & Plan Note (Signed)
Continues to cause intermittent trouble. Is following with ENT, Dr Cresenciano Lick. They will switch

## 2017-08-16 NOTE — Patient Instructions (Addendum)
Small bowel overgrowth, avoid GMO foods, consider Atrantil  Consider Lidocaine gel twice a day Abdominal Bloating When you have abdominal bloating, your abdomen may feel full, tight, or painful. It may also look bigger than normal or swollen (distended). Common causes of abdominal bloating include:  Swallowing air.  Constipation.  Problems digesting food.  Eating too much.  Irritable bowel syndrome. This is a condition that affects the large intestine.  Lactose intolerance. This is an inability to digest lactose, a natural sugar in dairy products.  Celiac disease. This is a condition that affects the ability to digest gluten, a protein found in some grains.  Gastroparesis. This is a condition that slows down the movement of food in the stomach and small intestine. It is more common in people with diabetes mellitus.  Gastroesophageal reflux disease (GERD). This is a digestive condition that makes stomach acid flow back into the esophagus.  Urinary retention. This means that the body is holding onto urine, and the bladder cannot be emptied all the way.  Follow these instructions at home: Eating and drinking  Avoid eating too much.  Try not to swallow air while talking or eating.  Avoid eating while lying down.  Avoid these foods and drinks: ? Foods that cause gas, such as broccoli, cabbage, cauliflower, and baked beans. ? Carbonated drinks. ? Hard candy. ? Chewing gum. Medicines  Take over-the-counter and prescription medicines only as told by your health care provider.  Take probiotic medicines. These medicines contain live bacteria or yeasts that can help digestion.  Take coated peppermint oil capsules. Activity  Try to exercise regularly. Exercise may help to relieve bloating that is caused by gas and relieve constipation. General instructions  Keep all follow-up visits as told by your health care provider. This is important. Contact a health care provider  if:  You have nausea and vomiting.  You have diarrhea.  You have abdominal pain.  You have unusual weight loss or weight gain.  You have severe pain, and medicines do not help. Get help right away if:  You have severe chest pain.  You have trouble breathing.  You have shortness of breath.  You have trouble urinating.  You have darker urine than normal.  You have blood in your stools or have dark, tarry stools. Summary  Abdominal bloating means that the abdomen is swollen.  Common causes of abdominal bloating are swallowing air, constipation, and problems digesting food.  Avoid eating too much and avoid swallowing air.  Avoid foods that cause gas, carbonated drinks, hard candy, and chewing gum. This information is not intended to replace advice given to you by your health care provider. Make sure you discuss any questions you have with your health care provider. Document Released: 11/06/2016 Document Revised: 11/06/2016 Document Reviewed: 11/06/2016 Elsevier Interactive Patient Education  [employer].

## 2017-08-16 NOTE — Assessment & Plan Note (Signed)
Mild and she has already seen her gynecologist and had an ultrasound that was unremarkable. Her bloating is intermittent and seems to be worse with she eats poorly. She has no change in bowels or appetite. No nausea vomiting or diarrhea. No bloody or tarry stool at this point she is pointing to work on avoiding offending foods, taking probiotics hydrating well and will consider over-the-counter on trunk tilt to see if that is helpful. If symptoms worsen she will notify us for referral to gastroenterology and colonoscopy. She have a cold that last year which was negative.

## 2017-08-16 NOTE — Progress Notes (Signed)
Subjective:  I acted as a Education administrator for Dr. Charlett Blake. [place], [place]  Patient ID: Nicole Lynch, female    DOB: Dec 14, 1965, 51 y.o.   MRN: [id number]  No chief complaint on file.   HPI  Patient is in today for a 6 month follow up And is overall doing well. She is struggling with some abdominal bloating worse after eating poorly. Has been seen by her gynecologist and ultrasound was negative for any concerning lesions. Her bowels are moving constantly. No bloody, tarry stool. No nausea, vomiting, diarrhea, constipation or change in appetite. His only other significant complaint is right ankle and right foot pain. She twisted her ankle 2 months ago with an inversion injury the ankle was hurting over the lateral malleolus and that has resolved but she still has pain over her fourth and fifth metatarsal when she applies pressure to it. She has not had any imaging otherwise the pain is manageable and not bad with weightbearing. Denies CP/palp/SOB/HA/congestion/fevers/GI or GU c/o. Taking meds as prescribed  Patient Care Team: Mosie Lukes, MD as PCP - General (Family Medicine) Paula Compton, MD as Consulting Physician (Obstetrics and Gynecology) Vicie Mutters, MD as Consulting Physician (Otolaryngology) Jari Pigg, MD as Consulting Physician (Dermatology)   Past Medical History:  Diagnosis Date  . Abdominal bloating 08/16/2017  . Allergy    seasonal  . Chicken pox as a child  . Depression 05   postpartum   . Meniere disease, left 02/03/2016  . Migraine   . Preventative health care 02/03/2016  . Sinusitis acute 06/29/2011  . SOM (serous otitis media) 02/03/2016  . Tick bite of shoulder 02/09/2017    Past Surgical History:  Procedure Laterality Date  . CESAREAN SECTION  2-05, 10-07   X 2  . deviated septum repair  1982    Family History  Problem Relation Age of Onset  . Vision loss Mother        macular degeneration  . Heart disease Father        afib on blood thinner  . Cancer  Father        prostate cancer    Social History   Social History  . Marital status: Married    Spouse name: N/A  . Number of children: 2  . Years of education: N/A   Occupational History  . Conservation officer, [employer]     Social History Main Topics  . Smoking status: Never Smoker  . Smokeless tobacco: Never Used  . Alcohol use No     Comment:    . Drug use: No  . Sexual activity: Not on file   Other Topics Concern  . Not on file   Social History Narrative  . No narrative on file    Outpatient Medications Prior to Visit  Medication Sig Dispense Refill  . albuterol (PROVENTIL HFA;VENTOLIN HFA) 108 (90 Base) MCG/ACT inhaler Inhale 2 puffs into the lungs every 6 (six) hours as needed for wheezing or shortness of breath. 1 Inhaler 0  . fluticasone (FLONASE) 50 MCG/ACT nasal spray Place 2 sprays into both nostrils daily. 16 g 1  . HYDROCORTISONE ACE, RECTAL, 30 MG SUPP Place 1 suppository (30 mg total) rectally at bedtime as needed. 28 each 1  . Misc Natural Products (GLUCOSAMINE CHONDROITIN MSM PO) Take by mouth daily. Reported on 02/03/2016    . OMEGA 3 1000 MG CAPS Take 1 capsule by mouth daily. Reported on 02/03/2016    . ondansetron (ZOFRAN) 4 MG tablet Take  1 tablet (4 mg total) by mouth every 8 (eight) hours as needed for nausea or vomiting. 20 tablet 0  . triamterene-hydrochlorothiazide (DYAZIDE) 37.5-25 MG capsule Take 1 capsule by mouth daily as needed. Reported on 02/03/2016  1  . SUMAtriptan (IMITREX) 100 MG tablet Take 1 tablet (100 mg total) by mouth every 2 (two) hours as needed for migraine. 10 tablet 5   No facility-administered medications prior to visit.     Allergies  Allergen Reactions  . Latex Itching    Review of Systems  Constitutional: Negative for fever and malaise/fatigue.  HENT: Negative for congestion.   Eyes: Negative for blurred vision.  Respiratory: Negative for shortness of breath.   Cardiovascular: Negative for chest pain, palpitations and leg swelling.    Gastrointestinal: Negative for abdominal pain, blood in stool and nausea.  Genitourinary: Negative for dysuria and frequency.  Musculoskeletal: Positive for joint pain. Negative for falls.  Skin: Negative for rash.  Neurological: Negative for dizziness, loss of consciousness and headaches.  Endo/Heme/Allergies: Negative for environmental allergies.  Psychiatric/Behavioral: Negative for depression. The patient is not nervous/anxious.        Objective:    Physical Exam  Constitutional: She is oriented to person, place, and time. She appears well-developed and well-nourished. No distress.  HENT:  Head: Normocephalic and atraumatic.  Nose: Nose normal.  Eyes: Right eye exhibits no discharge. Left eye exhibits no discharge.  Neck: Normal range of motion. Neck supple.  Cardiovascular: Normal rate and regular rhythm.   No murmur heard. Pulmonary/Chest: Effort normal and breath sounds normal.  Abdominal: Soft. Bowel sounds are normal. There is no tenderness.  Musculoskeletal: She exhibits no edema.  Neurological: She is alert and oriented to person, place, and time.  Skin: Skin is warm and dry.  Psychiatric: She has a normal mood and affect.  Nursing note and vitals reviewed.   BP 108/76 (BP Location: Left Arm, Patient Position: Sitting, Cuff Size: Normal)   Pulse 61   Temp 98.4 F (36.9 C) (Oral)   Resp 18   Wt 133 lb 6.4 oz (60.5 kg)   SpO2 98%   BMI 21.53 kg/m  Wt Readings from Last 3 Encounters:  08/16/17 133 lb 6.4 oz (60.5 kg)  02/09/17 130 lb 3.2 oz (59.1 kg)  10/21/16 133 lb 2 oz (60.4 kg)   BP Readings from Last 3 Encounters:  08/16/17 108/76  02/09/17 108/75  10/21/16 128/84     Immunization History  Administered Date(s) Administered  . Influenza Split 10/25/2012  . Influenza Whole 08/20/2011, 09/08/2013  . Influenza,inj,Quad PF,6+ Mos 08/05/2015  . PPD Test 01/01/2014  . Tdap 01/03/2014    Health Maintenance  Topic Date Due  . INFLUENZA VACCINE   05/19/2017  . PAP SMEAR  04/01/2018  . MAMMOGRAM  07/13/2019  . Fecal DNA (Cologuard)  03/04/2020  . TETANUS/TDAP  01/04/2024  . Hepatitis C Screening  Completed  . HIV Screening  Completed    Lab Results  Component Value Date   WBC 5.5 02/09/2017   HGB 13.9 02/09/2017   HCT 41.6 02/09/2017   PLT 202.0 02/09/2017   GLUCOSE 80 02/09/2017   CHOL 160 02/09/2017   TRIG 65.0 02/09/2017   HDL 65.20 02/09/2017   LDLCALC 81 02/09/2017   ALT 13 02/09/2017   AST 16 02/09/2017   NA 138 02/09/2017   K 3.7 02/09/2017   CL 103 02/09/2017   CREATININE 0.89 02/09/2017   BUN 18 02/09/2017   CO2 30 02/09/2017  TSH 1.43 02/09/2017    Lab Results  Component Value Date   TSH 1.43 02/09/2017   Lab Results  Component Value Date   WBC 5.5 02/09/2017   HGB 13.9 02/09/2017   HCT 41.6 02/09/2017   MCV 96.7 02/09/2017   PLT 202.0 02/09/2017   Lab Results  Component Value Date   NA 138 02/09/2017   K 3.7 02/09/2017   CO2 30 02/09/2017   GLUCOSE 80 02/09/2017   BUN 18 02/09/2017   CREATININE 0.89 02/09/2017   BILITOT 0.9 02/09/2017   ALKPHOS 52 02/09/2017   AST 16 02/09/2017   ALT 13 02/09/2017   PROT 7.1 02/09/2017   ALBUMIN 4.5 02/09/2017   CALCIUM 9.9 02/09/2017   GFR 70.57 02/09/2017   Lab Results  Component Value Date   CHOL 160 02/09/2017   Lab Results  Component Value Date   HDL 65.20 02/09/2017   Lab Results  Component Value Date   LDLCALC 81 02/09/2017   Lab Results  Component Value Date   TRIG 65.0 02/09/2017   Lab Results  Component Value Date   CHOLHDL 2 02/09/2017   No results found for: HGBA1C       Assessment & Plan:   Problem List Items Addressed This Visit    Meniere disease, left    Continues to cause intermittent trouble. Is following with ENT, Dr Cresenciano Lick. They will switch       Right foot pain    Twisted ankle with an inversion injury during a fall. Her lateral ankle is better but she still has pain over base of 5 th metatarsal when  she rests that foot on other foot at night. Is improving. She declines xray for now but will call if it worsens       Abdominal bloating    Mild and she has already seen her gynecologist and had an ultrasound that was unremarkable. Her bloating is intermittent and seems to be worse with she eats poorly. She has no change in bowels or appetite. No nausea vomiting or diarrhea. No bloody or tarry stool at this point she is pointing to work on avoiding offending foods, taking probiotics hydrating well and will consider over-the-counter on trunk tilt to see if that is helpful. If symptoms worsen she will notify us for referral to gastroenterology and colonoscopy. She have a cold that last year which was negative.         I am having Ms. Liptak maintain her Omega 3, Misc Natural Products (GLUCOSAMINE CHONDROITIN MSM PO), ondansetron, triamterene-hydrochlorothiazide, HYDROCORTISONE ACE (RECTAL), fluticasone, albuterol, and SUMAtriptan.  Meds ordered this encounter  Medications  . SUMAtriptan (IMITREX) 100 MG tablet    Sig: Take 1 tablet (100 mg total) by mouth every 2 (two) hours as needed for migraine.    Dispense:  10 tablet    Refill:  5    CMA served as scribe during this visit. History, Physical and Plan performed by medical provider. Documentation and orders reviewed and attested to.  Penni Homans, MD

## 2018-02-21 ENCOUNTER — Encounter: Payer: BLUE CROSS/BLUE SHIELD | Admitting: Family Medicine

## 2018-05-24 ENCOUNTER — Encounter: Payer: BLUE CROSS/BLUE SHIELD | Admitting: Family Medicine

## 2018-07-05 ENCOUNTER — Encounter: Payer: BLUE CROSS/BLUE SHIELD | Admitting: Family Medicine

## 2018-08-11 ENCOUNTER — Encounter: Payer: BLUE CROSS/BLUE SHIELD | Admitting: Family Medicine

## 2018-09-27 ENCOUNTER — Encounter: Payer: BLUE CROSS/BLUE SHIELD | Admitting: Family Medicine

## 2018-10-05 ENCOUNTER — Other Ambulatory Visit: Payer: Self-pay | Admitting: Family Medicine

## 2018-12-06 ENCOUNTER — Encounter: Payer: BLUE CROSS/BLUE SHIELD | Admitting: Family Medicine

## 2019-02-21 ENCOUNTER — Encounter: Payer: BLUE CROSS/BLUE SHIELD | Admitting: Family Medicine

## 2019-06-13 ENCOUNTER — Encounter: Payer: BLUE CROSS/BLUE SHIELD | Admitting: Family Medicine

## 2019-07-25 LAB — HM PAP SMEAR: HM Pap smear: NORMAL

## 2019-07-25 LAB — RESULTS CONSOLE HPV: CHL HPV: NEGATIVE

## 2019-07-25 LAB — HM MAMMOGRAPHY

## 2019-08-13 ENCOUNTER — Encounter (HOSPITAL_BASED_OUTPATIENT_CLINIC_OR_DEPARTMENT_OTHER): Payer: Self-pay

## 2019-08-13 ENCOUNTER — Other Ambulatory Visit: Payer: Self-pay

## 2019-08-13 ENCOUNTER — Emergency Department (HOSPITAL_BASED_OUTPATIENT_CLINIC_OR_DEPARTMENT_OTHER)
Admission: EM | Admit: 2019-08-13 | Discharge: 2019-08-13 | Disposition: A | Discharge status: Home / Self Care | Payer: 59 | Attending: Emergency Medicine | Admitting: Emergency Medicine

## 2019-08-13 DIAGNOSIS — Z91040 Latex allergy status: Secondary | ICD-10-CM | POA: Insufficient documentation

## 2019-08-13 DIAGNOSIS — R519 Headache, unspecified: Secondary | ICD-10-CM | POA: Diagnosis present

## 2019-08-13 DIAGNOSIS — G43001 Migraine without aura, not intractable, with status migrainosus: Secondary | ICD-10-CM | POA: Insufficient documentation

## 2019-08-13 MED ORDER — SODIUM CHLORIDE 0.9 % IV BOLUS
500.0000 mL | Freq: Once | INTRAVENOUS | Status: AC
  Administered 2019-08-13: 500 mL via INTRAVENOUS

## 2019-08-13 MED ORDER — DIPHENHYDRAMINE HCL 50 MG/ML IJ SOLN
25.0000 mg | Freq: Once | INTRAMUSCULAR | Status: AC
  Administered 2019-08-13: 25 mg via INTRAVENOUS
  Filled 2019-08-13: qty 1

## 2019-08-13 MED ORDER — METOCLOPRAMIDE HCL 5 MG/ML IJ SOLN
10.0000 mg | Freq: Once | INTRAMUSCULAR | Status: AC
  Administered 2019-08-13: 20:00:00 10 mg via INTRAVENOUS
  Filled 2019-08-13: qty 2

## 2019-08-13 MED ORDER — KETOROLAC TROMETHAMINE 30 MG/ML IJ SOLN
30.0000 mg | Freq: Once | INTRAMUSCULAR | Status: AC
  Administered 2019-08-13: 30 mg via INTRAVENOUS
  Filled 2019-08-13: qty 1

## 2019-08-13 MED ORDER — ONDANSETRON 4 MG PO TBDP: 4.0000 mg | ORAL_TABLET | Freq: Three times a day (TID) | ORAL | 0 refills | Status: DC | PRN

## 2019-08-13 NOTE — Discharge Instructions (Signed)

## 2019-08-13 NOTE — ED Notes (Signed)
Pt states she is feeling better and desires discharge. EDP notified.

## 2019-08-13 NOTE — ED Provider Notes (Signed)
Emergency Department Provider Note   I have reviewed the triage vital signs and the nursing notes.   HISTORY  Chief Complaint Headache   HPI Nicole Lynch is a 53 y.o. female with PMH if migraine HA presents to the ED with HA and nausea typical of her migraine. She is typically able to control symptoms of HA at home but nausea worsened today and she was unable to control symptoms at home. She notes photophobia. Pain is mainly left sided. No numbness/weakness. No fever/chills.   Past Medical History:  Diagnosis Date  . Abdominal bloating 08/16/2017  . Allergy    seasonal  . Chicken pox as a child  . Depression 3   postpartum   . Meniere disease, left 02/03/2016  . Migraine   . Preventative health care 02/03/2016  . Sinusitis acute 06/29/2011  . SOM (serous otitis media) 02/03/2016  . Tick bite of shoulder 02/09/2017    Patient Active Problem List   Diagnosis Date Noted  . Right foot pain 08/16/2017  . Abdominal bloating 08/16/2017  . Preventative health care 02/03/2016  . Meniere disease, left 02/03/2016  . Palpitations 08/31/2014  . Cough 10/07/2011  . Skin cancer 06/29/2011  . Allergic state   . Chicken pox   . Depression   . Migraine     Past Surgical History:  Procedure Laterality Date  . CESAREAN SECTION  2-05, 10-07   X 2  . deviated septum repair  1982    Allergies Latex  Family History  Problem Relation Age of Onset  . Vision loss Mother        macular degeneration  . Heart disease Father        afib on blood thinner  . Cancer Father        prostate cancer    Social History Social History   Tobacco Use  . Smoking status: Never Smoker  . Smokeless tobacco: Never Used  Substance Use Topics  . Alcohol use: No    Alcohol/week: 0.0 standard drinks    Comment:    . Drug use: No    Review of Systems  Constitutional: No fever/chills Eyes: No visual changes. ENT: No sore throat. Cardiovascular: Denies chest pain. Respiratory: Denies  shortness of breath. Gastrointestinal: No abdominal pain. Positive nausea, no vomiting.  No diarrhea.  No constipation. Genitourinary: Negative for dysuria. Musculoskeletal: Negative for back pain. Skin: Negative for rash. Neurological: Negative for focal weakness or numbness. Positive HA.   10-point ROS otherwise negative.  ____________________________________________   PHYSICAL EXAM:  VITAL SIGNS: ED Triage Vitals  Enc Vitals Group     BP 08/13/19 1939 (!) 111/92     Pulse Rate 08/13/19 1939 63     Resp 08/13/19 1939 20     Temp 08/13/19 1939 98.6 F (37 C)     Temp Source 08/13/19 1939 Oral     SpO2 08/13/19 1939 100 %     Weight 08/13/19 1941 135 lb (61.2 kg)     Height 08/13/19 1941 5\' 4"  (1.626 m)   Constitutional: Alert and oriented. Appears uncomfortable.  Eyes: Conjunctivae are normal. Positive photophobia.  Head: Atraumatic. Nose: No congestion/rhinnorhea. Mouth/Throat: Mucous membranes are moist.   Neck: No stridor.  Cardiovascular: Well perfused.  Respiratory: Normal respiratory effort.  Gastrointestinal: No distention.  Musculoskeletal:  No gross deformities of extremities. Neurologic:  Normal speech and language.  Skin:  Skin is warm, dry and intact. No rash noted.  ____________________________________________  RADIOLOGY  None ____________________________________________  PROCEDURES  Procedure(s) performed:   Procedures  None ____________________________________________   INITIAL IMPRESSION / ASSESSMENT AND PLAN / ED COURSE  Pertinent labs & imaging results that were available during my care of the patient were reviewed by me and considered in my medical decision making (see chart for details).   Patient with HA typical of her migraine HA with nausea. No neuro deficits. Patient symptoms well-controlled with meds here. Discharging home with Zofran. Plan for Neuro f/u PRN.    ____________________________________________  FINAL CLINICAL  IMPRESSION(S) / ED DIAGNOSES  Final diagnoses:  Migraine without aura and with status migrainosus, not intractable     MEDICATIONS GIVEN DURING THIS VISIT:  Medications  sodium chloride 0.9 % bolus 500 mL (0 mLs Intravenous Stopped 08/13/19 2056)  ketorolac (TORADOL) 30 MG/ML injection 30 mg (30 mg Intravenous Given 08/13/19 2004)  metoCLOPramide (REGLAN) injection 10 mg (10 mg Intravenous Given 08/13/19 2006)  diphenhydrAMINE (BENADRYL) injection 25 mg (25 mg Intravenous Given 08/13/19 2004)     NEW OUTPATIENT MEDICATIONS STARTED DURING THIS VISIT:  Discharge Medication List as of 08/13/2019  8:47 PM    START taking these medications   Details  ondansetron (ZOFRAN ODT) 4 MG disintegrating tablet Take 1 tablet (4 mg total) by mouth every 8 (eight) hours as needed., Starting Sun 08/13/2019, Print        Note:  This document was prepared using Dragon voice recognition software and may include unintentional dictation errors.  Nanda Quinton, MD, [facility]    Long, [facility], MD 08/14/19 [phone]

## 2019-08-13 NOTE — ED Notes (Signed)
ED Provider at bedside. 

## 2019-08-13 NOTE — ED Triage Notes (Signed)
Pt c/o HA with photo/phonophobia that started today with a gradual onset.

## 2019-09-01 ENCOUNTER — Encounter: Payer: BLUE CROSS/BLUE SHIELD | Admitting: Family Medicine

## 2019-09-01 ENCOUNTER — Telehealth: Payer: Self-pay

## 2019-09-01 NOTE — Telephone Encounter (Signed)
Called primary #. Husband will have pt call back.  Please offer CPE on 09/26/2019 at 2:40pm (ok per Dr. Charlett Blake)

## 2019-09-01 NOTE — Telephone Encounter (Signed)
Copied from [place] [id number]. Topic: Appointment Scheduling - Scheduling Inquiry for Clinic >> Aug 31, 2019  4:50 PM Sheran Luz wrote: Patient calling to reschedule CPE with Dr. Charlett Blake.

## 2019-09-25 ENCOUNTER — Other Ambulatory Visit: Payer: Self-pay

## 2019-09-26 ENCOUNTER — Encounter: Payer: 59 | Admitting: Family Medicine

## 2019-11-06 ENCOUNTER — Ambulatory Visit (INDEPENDENT_AMBULATORY_CARE_PROVIDER_SITE_OTHER): Payer: 59 | Admitting: Family Medicine

## 2019-11-06 ENCOUNTER — Other Ambulatory Visit: Payer: Self-pay

## 2019-11-06 ENCOUNTER — Encounter: Payer: Self-pay | Admitting: Family Medicine

## 2019-11-06 VITALS — BP 100/60 | HR 56 | Temp 98.5°F | Resp 18 | Wt 138.6 lb

## 2019-11-06 DIAGNOSIS — G43709 Chronic migraine without aura, not intractable, without status migrainosus: Secondary | ICD-10-CM | POA: Diagnosis not present

## 2019-11-06 DIAGNOSIS — R140 Abdominal distension (gaseous): Secondary | ICD-10-CM

## 2019-11-06 DIAGNOSIS — Z1231 Encounter for screening mammogram for malignant neoplasm of breast: Secondary | ICD-10-CM

## 2019-11-06 DIAGNOSIS — Z0000 Encounter for general adult medical examination without abnormal findings: Secondary | ICD-10-CM | POA: Diagnosis not present

## 2019-11-06 DIAGNOSIS — R002 Palpitations: Secondary | ICD-10-CM

## 2019-11-06 DIAGNOSIS — C4490 Unspecified malignant neoplasm of skin, unspecified: Secondary | ICD-10-CM

## 2019-11-06 NOTE — Assessment & Plan Note (Signed)
Improved some with spirolina, hemp hearts and flax seed

## 2019-11-06 NOTE — Assessment & Plan Note (Signed)
Patient encouraged to maintain heart healthy diet, regular exercise, adequate sleep. Consider daily probiotics. Take medications as prescribed. Labs ordered and reviewed. She recieves her paps and mammograms with OB/GYN records are requested.

## 2019-11-06 NOTE — Patient Instructions (Addendum)
Sarina Ill at [facility] for the headaches  Hydrate, eat protein, exercise, heart healthy diet  Encouraged increased hydration and fiber in diet. Daily probiotics. If bowels not moving can use MOM 2 tbls po in 4 oz of warm prune juice by mouth every 2-3 days. If no results then repeat in 4 hours with  Dulcolax suppository pr, may repeat again in 4 more hours as needed. Seek care if symptoms worsen. Consider daily Miralax and/or Dulcolax if symptoms persist. Can respond in 1-8 hours  Consider MIralax with Benefiber once to twice a day as needed  Lamisil for athlete's foot  Encouraged moist heat and gentle stretching as tolerated. May try NSAIDs and prescription meds as directed and report if symptoms worsen or seek immediate care  [facility]  For Covid   Pulse oximeter, want oxygen in the 90s  Multivitamin with minerals, selenium Vitamin D 10-1998 IU daily Aspirin EC 81 daily Probiotic daily   Melatonin 2.5 mg to 5 mg at bedtime Preventive Care 21-13 Years Old, Female Preventive care refers to visits with your health care provider and lifestyle choices that can promote health and wellness. This includes:  A yearly physical exam. This may also be called an annual well check.  Regular dental visits and eye exams.  Immunizations.  Screening for certain conditions.  Healthy lifestyle choices, such as eating a healthy diet, getting regular exercise, not using drugs or products that contain nicotine and tobacco, and limiting alcohol use. What can I expect for my preventive care visit? Physical exam Your health care provider will check your:  Height and weight. This may be used to calculate body mass index (BMI), which tells if you are at a healthy weight.  Heart rate and blood pressure.  Skin for abnormal spots. Counseling Your health care provider may ask you questions about your:  Alcohol, tobacco, and drug use.  Emotional  well-being.  Home and relationship well-being.  Sexual activity.  Eating habits.  Work and work Statistician.  Method of birth control.  Menstrual cycle.  Pregnancy history. What immunizations do I need?  Influenza (flu) vaccine  This is recommended every year. Tetanus, diphtheria, and pertussis (Tdap) vaccine  You may need a Td booster every 10 years. Varicella (chickenpox) vaccine  You may need this if you have not been vaccinated. Zoster (shingles) vaccine  You may need this after age 33. Measles, mumps, and rubella (MMR) vaccine  You may need at least one dose of MMR if you were born in 1957 or later. You may also need a second dose. Pneumococcal conjugate (PCV13) vaccine  You may need this if you have certain conditions and were not previously vaccinated. Pneumococcal polysaccharide (PPSV23) vaccine  You may need one or two doses if you smoke cigarettes or if you have certain conditions. Meningococcal conjugate (MenACWY) vaccine  You may need this if you have certain conditions. Hepatitis A vaccine  You may need this if you have certain conditions or if you travel or work in places where you may be exposed to hepatitis A. Hepatitis B vaccine  You may need this if you have certain conditions or if you travel or work in places where you may be exposed to hepatitis B. Haemophilus influenzae type b (Hib) vaccine  You may need this if you have certain conditions. Human papillomavirus (HPV) vaccine  If recommended by your health care provider, you may need three doses over 6 months. You may receive vaccines as individual doses or as  more than one vaccine together in one shot (combination vaccines). Talk with your health care provider about the risks and benefits of combination vaccines. What tests do I need? Blood tests  Lipid and cholesterol levels. These may be checked every 5 years, or more frequently if you are over 71 years old.  Hepatitis C  test.  Hepatitis B test. Screening  Lung cancer screening. You may have this screening every year starting at age 33 if you have a 30-pack-year history of smoking and currently smoke or have quit within the past 15 years.  Colorectal cancer screening. All adults should have this screening starting at age 23 and continuing until age 78. Your health care provider may recommend screening at age 44 if you are at increased risk. You will have tests every 1-10 years, depending on your results and the type of screening test.  Diabetes screening. This is done by checking your blood sugar (glucose) after you have not eaten for a while (fasting). You may have this done every 1-3 years.  Mammogram. This may be done every 1-2 years. Talk with your health care provider about when you should start having regular mammograms. This may depend on whether you have a family history of breast cancer.  BRCA-related cancer screening. This may be done if you have a family history of breast, ovarian, tubal, or peritoneal cancers.  Pelvic exam and Pap test. This may be done every 3 years starting at age 5. Starting at age 22, this may be done every 5 years if you have a Pap test in combination with an HPV test. Other tests  Sexually transmitted disease (STD) testing.  Bone density scan. This is done to screen for osteoporosis. You may have this scan if you are at high risk for osteoporosis. Follow these instructions at home: Eating and drinking  Eat a diet that includes fresh fruits and vegetables, whole grains, lean protein, and low-fat dairy.  Take vitamin and mineral supplements as recommended by your health care provider.  Do not drink alcohol if: ? Your health care provider tells you not to drink. ? You are pregnant, may be pregnant, or are planning to become pregnant.  If you drink alcohol: ? Limit how much you have to 0-1 drink a day. ? Be aware of how much alcohol is in your drink. In the [place] one  drink equals one 12 oz bottle of beer (355 mL), one 5 oz glass of wine (148 mL), or one 1 oz glass of hard liquor (44 mL). Lifestyle  Take daily care of your teeth and gums.  Stay active. Exercise for at least 30 minutes on 5 or more days each week.  Do not use any products that contain nicotine or tobacco, such as cigarettes, e-cigarettes, and chewing tobacco. If you need help quitting, ask your health care provider.  If you are sexually active, practice safe sex. Use a condom or other form of birth control (contraception) in order to prevent pregnancy and STIs (sexually transmitted infections).  If told by your health care provider, take low-dose aspirin daily starting at age 44. What's next?  Visit your health care provider once a year for a well check visit.  Ask your health care provider how often you should have your eyes and teeth checked.  Stay up to date on all vaccines. This information is not intended to replace advice given to you by your health care provider. Make sure you discuss any questions you have with your health  care provider. Document Revised: 06/16/2018 Document Reviewed: 06/16/2018 Elsevier Patient Education  2020 [employer].

## 2019-11-06 NOTE — Assessment & Plan Note (Addendum)
Lasts 3 days when comes but is coming less often. Sumitriptan takes the edge for maybe 10 hours but when it returns. She agrees to return to neurology to consider new treatments such as Aimovig. The frequent dosing of Sumitriptan she has some constipation.

## 2019-11-06 NOTE — Assessment & Plan Note (Signed)
fofllows now with dermatology for concerning lesions noted

## 2019-11-06 NOTE — Progress Notes (Signed)
Subjective:    Patient ID: Nicole Lynch, female    DOB: 1966/01/22, 54 y.o.   MRN: [id number]  No chief complaint on file.   HPI Patient is in today for annual preventative exam. She denies any recent febrile illness or hospitalizations. Notes she is working excessive hours at her job and things have been stressful at home. She notes some anhedonia and struggles to get everything done. She is eating well and staying active. Denies CP/palp/SOB/HA/congestion/fevers/GI or GU c/o. Taking meds as prescribed  Past Medical History:  Diagnosis Date  . Abdominal bloating 08/16/2017  . Allergy    seasonal  . Chicken pox as a child  . Depression 05   postpartum   . Meniere disease, left 02/03/2016  . Migraine   . Preventative health care 02/03/2016  . Sinusitis acute 06/29/2011  . SOM (serous otitis media) 02/03/2016  . Tick bite of shoulder 02/09/2017    Past Surgical History:  Procedure Laterality Date  . CESAREAN SECTION  2-05, 10-07   X 2  . deviated septum repair  1982    Family History  Problem Relation Age of Onset  . Vision loss Mother        macular degeneration  . Heart disease Father        afib on blood thinner  . Cancer Father        prostate cancer    Social History   Socioeconomic History  . Marital status: Married    Spouse name: Not on file  . Number of children: 2  . Years of education: Not on file  . Highest education level: Not on file  Occupational History  . Occupation: Conservation officer, nature   Tobacco Use  . Smoking status: Never Smoker  . Smokeless tobacco: Never Used  Substance and Sexual Activity  . Alcohol use: No    Alcohol/week: 0.0 standard drinks    Comment:    . Drug use: No  . Sexual activity: Not on file  Other Topics Concern  . Not on file  Social History Narrative  . Not on file   Social Determinants of Health   Financial Resource Strain:   . Difficulty of Paying Living Expenses: Not on file  Food Insecurity:   . Worried About  Charity fundraiser in the Last Year: Not on file  . Ran Out of Food in the Last Year: Not on file  Transportation Needs:   . Lack of Transportation (Medical): Not on file  . Lack of Transportation (Non-Medical): Not on file  Physical Activity:   . Days of Exercise per Week: Not on file  . Minutes of Exercise per Session: Not on file  Stress:   . Feeling of Stress : Not on file  Social Connections:   . Frequency of Communication with Friends and Family: Not on file  . Frequency of Social Gatherings with Friends and Family: Not on file  . Attends Religious Services: Not on file  . Active Member of Clubs or Organizations: Not on file  . Attends Archivist Meetings: Not on file  . Marital Status: Not on file  Intimate Partner Violence:   . Fear of Current or Ex-Partner: Not on file  . Emotionally Abused: Not on file  . Physically Abused: Not on file  . Sexually Abused: Not on file    Outpatient Medications Prior to Visit  Medication Sig Dispense Refill  . albuterol (PROVENTIL HFA;VENTOLIN HFA) 108 (90 Base) MCG/ACT inhaler Inhale  2 puffs into the lungs every 6 (six) hours as needed for wheezing or shortness of breath. 1 Inhaler 0  . fluticasone (FLONASE) 50 MCG/ACT nasal spray Place 2 sprays into both nostrils daily. 16 g 1  . HYDROCORTISONE ACE, RECTAL, 30 MG SUPP Place 1 suppository (30 mg total) rectally at bedtime as needed. 28 each 1  . Misc Natural Products (GLUCOSAMINE CHONDROITIN MSM PO) Take by mouth daily. Reported on 02/03/2016    . OMEGA 3 1000 MG CAPS Take 1 capsule by mouth daily. Reported on 02/03/2016    . ondansetron (ZOFRAN ODT) 4 MG disintegrating tablet Take 1 tablet (4 mg total) by mouth every 8 (eight) hours as needed. 20 tablet 0  . ondansetron (ZOFRAN) 4 MG tablet Take 1 tablet (4 mg total) by mouth every 8 (eight) hours as needed for nausea or vomiting. 20 tablet 0  . SUMAtriptan (IMITREX) 100 MG tablet TAKE 1 TABLET BY MOUTH EVERY 2 HOURS AS NEEDED FOR  MIGRAINE 9 tablet 5  . triamterene-hydrochlorothiazide (DYAZIDE) 37.5-25 MG capsule Take 1 capsule by mouth daily as needed. Reported on 02/03/2016  1   No facility-administered medications prior to visit.    Allergies  Allergen Reactions  . Latex Itching    Review of Systems  Constitutional: Positive for malaise/fatigue. Negative for fever.  HENT: Negative for congestion.   Eyes: Negative for blurred vision.  Respiratory: Negative for shortness of breath.   Cardiovascular: Negative for chest pain, palpitations and leg swelling.  Gastrointestinal: Negative for abdominal pain, blood in stool and nausea.  Genitourinary: Negative for dysuria and frequency.  Musculoskeletal: Negative for falls.  Skin: Negative for rash.  Neurological: Negative for dizziness, loss of consciousness and headaches.  Endo/Heme/Allergies: Negative for environmental allergies.  Psychiatric/Behavioral: Negative for depression. The patient is nervous/anxious.        Objective:    Physical Exam Constitutional:      General: She is not in acute distress.    Appearance: She is well-developed.  HENT:     Head: Normocephalic and atraumatic.  Eyes:     Conjunctiva/sclera: Conjunctivae normal.  Neck:     Thyroid: No thyromegaly.  Cardiovascular:     Rate and Rhythm: Normal rate and regular rhythm.     Heart sounds: Normal heart sounds. No murmur.  Pulmonary:     Effort: Pulmonary effort is normal. No respiratory distress.     Breath sounds: Normal breath sounds.  Abdominal:     General: Bowel sounds are normal. There is no distension.     Palpations: Abdomen is soft. There is no mass.     Tenderness: There is no abdominal tenderness.  Musculoskeletal:     Cervical back: Neck supple.  Lymphadenopathy:     Cervical: No cervical adenopathy.  Skin:    General: Skin is warm and dry.  Neurological:     Mental Status: She is alert and oriented to person, place, and time.  Psychiatric:        Behavior:  Behavior normal.     BP 100/60 (BP Location: Left Arm, Patient Position: Sitting, Cuff Size: Normal)   Pulse (!) 56   Temp 98.5 F (36.9 C) (Temporal)   Resp 18   Wt 138 lb 9.6 oz (62.9 kg)   SpO2 97%   BMI 23.79 kg/m  Wt Readings from Last 3 Encounters:  11/06/19 138 lb 9.6 oz (62.9 kg)  08/13/19 135 lb (61.2 kg)  08/16/17 133 lb 6.4 oz (60.5 kg)  Diabetic Foot Exam - Simple   No data filed     Lab Results  Component Value Date   WBC 5.5 02/09/2017   HGB 13.9 02/09/2017   HCT 41.6 02/09/2017   PLT 202.0 02/09/2017   GLUCOSE 80 02/09/2017   CHOL 160 02/09/2017   TRIG 65.0 02/09/2017   HDL 65.20 02/09/2017   LDLCALC 81 02/09/2017   ALT 13 02/09/2017   AST 16 02/09/2017   NA 138 02/09/2017   K 3.7 02/09/2017   CL 103 02/09/2017   CREATININE 0.89 02/09/2017   BUN 18 02/09/2017   CO2 30 02/09/2017   TSH 1.43 02/09/2017    Lab Results  Component Value Date   TSH 1.43 02/09/2017   Lab Results  Component Value Date   WBC 5.5 02/09/2017   HGB 13.9 02/09/2017   HCT 41.6 02/09/2017   MCV 96.7 02/09/2017   PLT 202.0 02/09/2017   Lab Results  Component Value Date   NA 138 02/09/2017   K 3.7 02/09/2017   CO2 30 02/09/2017   GLUCOSE 80 02/09/2017   BUN 18 02/09/2017   CREATININE 0.89 02/09/2017   BILITOT 0.9 02/09/2017   ALKPHOS 52 02/09/2017   AST 16 02/09/2017   ALT 13 02/09/2017   PROT 7.1 02/09/2017   ALBUMIN 4.5 02/09/2017   CALCIUM 9.9 02/09/2017   GFR 70.57 02/09/2017   Lab Results  Component Value Date   CHOL 160 02/09/2017   Lab Results  Component Value Date   HDL 65.20 02/09/2017   Lab Results  Component Value Date   LDLCALC 81 02/09/2017   Lab Results  Component Value Date   TRIG 65.0 02/09/2017   Lab Results  Component Value Date   CHOLHDL 2 02/09/2017   No results found for: HGBA1C     Assessment & Plan:   Problem List Items Addressed This Visit    Migraine    Lasts 3 days when comes but is coming less often.  Sumitriptan takes the edge for maybe 10 hours but when it returns. She agrees to return to neurology to consider new treatments such as Aimovig. The frequent dosing of Sumitriptan she has some constipation.       Skin cancer    fofllows now with dermatology for concerning lesions noted      Palpitations - Primary   Preventative health care    Patient encouraged to maintain heart healthy diet, regular exercise, adequate sleep. Consider daily probiotics. Take medications as prescribed. Labs ordered and reviewed. She recieves her paps and mammograms with OB/GYN records are requested.       Relevant Orders   CBC   Comprehensive metabolic panel   Lipid panel   TSH   Abdominal bloating    Improved some with spirolina, hemp hearts and flax seed       Other Visit Diagnoses    Encounter for screening mammogram for malignant neoplasm of breast          I am having Percell Belt maintain her Omega 3, Misc Natural Products (GLUCOSAMINE CHONDROITIN MSM PO), ondansetron, triamterene-hydrochlorothiazide, HYDROCORTISONE ACE (RECTAL), fluticasone, albuterol, SUMAtriptan, and ondansetron.  No orders of the defined types were placed in this encounter.    Penni Homans, MD

## 2019-11-07 LAB — CBC
HCT: 41 % (ref 36.0–46.0)
Hemoglobin: 13.5 g/dL (ref 12.0–15.0)
MCHC: 33 g/dL (ref 30.0–36.0)
MCV: 97.4 fl (ref 78.0–100.0)
Platelets: 226 10*3/uL (ref 150.0–400.0)
RBC: 4.21 Mil/uL (ref 3.87–5.11)
RDW: 12.6 % (ref 11.5–15.5)
WBC: 6.9 10*3/uL (ref 4.0–10.5)

## 2019-11-07 LAB — COMPREHENSIVE METABOLIC PANEL
ALT: 14 U/L (ref 0–35)
AST: 16 U/L (ref 0–37)
Albumin: 4.4 g/dL (ref 3.5–5.2)
Alkaline Phosphatase: 69 U/L (ref 39–117)
BUN: 19 mg/dL (ref 6–23)
CO2: 28 mEq/L (ref 19–32)
Calcium: 9.6 mg/dL (ref 8.4–10.5)
Chloride: 103 mEq/L (ref 96–112)
Creatinine, Ser: 0.94 mg/dL (ref 0.40–1.20)
GFR: 61.7 mL/min (ref >60.00)
Glucose, Bld: 75 mg/dL (ref 70–99)
Potassium: 3.9 mEq/L (ref 3.5–5.1)
Sodium: 139 mEq/L (ref 135–145)
Total Bilirubin: 0.6 mg/dL (ref 0.2–1.2)
Total Protein: 7.2 g/dL (ref 6.0–8.3)

## 2019-11-07 LAB — TSH: TSH: 1.02 u[IU]/mL (ref 0.35–4.50)

## 2019-11-07 LAB — LIPID PANEL
Cholesterol: 161 mg/dL (ref 0–200)
HDL: 60.1 mg/dL (ref >39.00)
LDL Cholesterol: 83 mg/dL (ref 0–99)
NonHDL: 100.87
Total CHOL/HDL Ratio: 3
Triglycerides: 88 mg/dL (ref 0.0–149.0)
VLDL: 17.6 mg/dL (ref 0.0–40.0)

## 2019-11-22 ENCOUNTER — Telehealth: Payer: Self-pay | Admitting: Family Medicine

## 2019-11-22 NOTE — Telephone Encounter (Signed)
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 She was masked and not directly seated with the person who tested positive. Alin is not having any symptoms. Should she quarantine w/in the home from her family? Should she wear a mask in the home? Does she need to be tested and when?

## 2019-11-23 ENCOUNTER — Encounter: Payer: Self-pay | Admitting: Family Medicine

## 2019-11-23 NOTE — Telephone Encounter (Signed)
Pt called back stating employer wanted to know if her doctor advised quarantine. If so they need ER leave of absence paperwork completed. Spoke with Princess and advised pt that she does not need to quarantine from family or work. Pt is able to return to work unless they advise pt to be tested. Recommended testing if she has symptoms or at least 5 days after exposure. Pt has not developed symptoms and aware if she does to contact office for virtual appt.

## 2019-11-24 NOTE — Telephone Encounter (Signed)
Please advise 

## 2019-11-24 NOTE — Telephone Encounter (Signed)
That is correct she does not have to quarantine unless she develops symptoms if she does she should go to [email or web address] and get tested and set up a virtual visit so we can document her symptoms and advise how long to be out of work and treat her accordingly

## 2019-12-01 ENCOUNTER — Other Ambulatory Visit: Payer: Self-pay | Admitting: Family Medicine

## 2019-12-01 ENCOUNTER — Telehealth: Payer: Self-pay | Admitting: Family Medicine

## 2019-12-01 NOTE — Telephone Encounter (Signed)
Medication:SUMAtriptan (IMITREX) 100 MG tablet   Has the patient contacted their pharmacy? Yes.   (If no, request that the patient contact the pharmacy for the refill.) (If yes, when and what did the pharmacy advise?)  They told her that it would be next Thursday. She stated she will be it before then    Preferred Pharmacy (with phone number or street name):   [facility] [place] [facility] [place] - [address]  [place], [place] [address]  Phone:  [phone] Fax:  [phone]   Agent: Please be advised that RX refills may take up to 3 business days. We ask that you follow-up with your pharmacy.

## 2019-12-01 NOTE — Telephone Encounter (Signed)
Medication sent in previous encounter

## 2019-12-20 ENCOUNTER — Emergency Department (HOSPITAL_BASED_OUTPATIENT_CLINIC_OR_DEPARTMENT_OTHER)
Admission: EM | Admit: 2019-12-20 | Discharge: 2019-12-21 | Disposition: A | Discharge status: Home / Self Care | Payer: 59 | Attending: Emergency Medicine | Admitting: Emergency Medicine

## 2019-12-20 ENCOUNTER — Other Ambulatory Visit: Payer: Self-pay

## 2019-12-20 ENCOUNTER — Encounter (HOSPITAL_BASED_OUTPATIENT_CLINIC_OR_DEPARTMENT_OTHER): Payer: Self-pay

## 2019-12-20 DIAGNOSIS — G43009 Migraine without aura, not intractable, without status migrainosus: Secondary | ICD-10-CM

## 2019-12-20 DIAGNOSIS — R519 Headache, unspecified: Secondary | ICD-10-CM | POA: Diagnosis present

## 2019-12-20 DIAGNOSIS — G43909 Migraine, unspecified, not intractable, without status migrainosus: Secondary | ICD-10-CM | POA: Diagnosis not present

## 2019-12-20 MED ORDER — SODIUM CHLORIDE 0.9 % IV BOLUS
1000.0000 mL | Freq: Once | INTRAVENOUS | Status: AC
  Administered 2019-12-20: 1000 mL via INTRAVENOUS

## 2019-12-20 MED ORDER — PROCHLORPERAZINE EDISYLATE 10 MG/2ML IJ SOLN
10.0000 mg | Freq: Once | INTRAMUSCULAR | Status: AC
  Administered 2019-12-20: 10 mg via INTRAVENOUS
  Filled 2019-12-20: qty 2

## 2019-12-20 MED ORDER — DIPHENHYDRAMINE HCL 50 MG/ML IJ SOLN
25.0000 mg | Freq: Once | INTRAMUSCULAR | Status: AC
  Administered 2019-12-20: 25 mg via INTRAVENOUS
  Filled 2019-12-20: qty 1

## 2019-12-20 MED ORDER — DEXAMETHASONE 6 MG PO TABS
10.0000 mg | ORAL_TABLET | Freq: Once | ORAL | Status: AC
  Administered 2019-12-20: 10 mg via ORAL
  Filled 2019-12-20: qty 1

## 2019-12-20 NOTE — ED Triage Notes (Signed)
Pt c/o migraine HA that started at 17:00 today. Onset was gradual with a dull ache and nausea.

## 2019-12-20 NOTE — ED Provider Notes (Signed)
[facility] EMERGENCY DEPARTMENT Provider Note   CSN: [id number] Arrival date & time: 12/20/19  2156     History Chief Complaint  Patient presents with  . Headache    Nicole Lynch is a 54 y.o. female.  54 yo F with a chief complaint of a headache.  Feels like her typical migraine started about 5 hours ago.  Associated with nausea and vomiting.  Denies unilateral numbness or weakness denies difficulty with speech or swallowing denies fevers denies trauma.   The history is provided by the patient.  Headache Pain location:  Generalized Quality:  Unable to specify Radiates to:  Does not radiate Severity currently:  9/10 Severity at highest:  9/10 Onset quality:  Gradual Duration:  5 hours Timing:  Constant Progression:  Unchanged Chronicity:  Recurrent Similar to prior headaches: yes   Context: bright light and loud noise   Relieved by:  Nothing Worsened by:  Nothing Ineffective treatments:  DHE, prescription medications and resting in a darkened room Associated symptoms: nausea and vomiting   Associated symptoms: no congestion, no dizziness, no fever and no myalgias        Past Medical History:  Diagnosis Date  . Abdominal bloating 08/16/2017  . Allergy    seasonal  . Chicken pox as a child  . Depression 3   postpartum   . Meniere disease, left 02/03/2016  . Migraine   . Preventative health care 02/03/2016  . Sinusitis acute 06/29/2011  . SOM (serous otitis media) 02/03/2016  . Tick bite of shoulder 02/09/2017    Patient Active Problem List   Diagnosis Date Noted  . Abdominal bloating 08/16/2017  . Preventative health care 02/03/2016  . Meniere disease, left 02/03/2016  . Palpitations 08/31/2014  . Skin cancer 06/29/2011  . Allergic state   . Chicken pox   . Depression   . Migraine     Past Surgical History:  Procedure Laterality Date  . CESAREAN SECTION  2-05, 10-07   X 2  . deviated septum repair  1982     OB History   No obstetric  history on file.     Family History  Problem Relation Age of Onset  . Vision loss Mother        macular degeneration  . Heart disease Father        afib on blood thinner  . Cancer Father        prostate cancer    Social History   Tobacco Use  . Smoking status: Never Smoker  . Smokeless tobacco: Never Used  Substance Use Topics  . Alcohol use: No    Alcohol/week: 0.0 standard drinks    Comment:    . Drug use: No    Home Medications Prior to Admission medications   Medication Sig Start Date End Date Taking? Authorizing Provider  albuterol (PROVENTIL HFA;VENTOLIN HFA) 108 (90 Base) MCG/ACT inhaler Inhale 2 puffs into the lungs every 6 (six) hours as needed for wheezing or shortness of breath. 10/21/16   Saguier, Percell Miller, PA-C  fluticasone (FLONASE) 50 MCG/ACT nasal spray Place 2 sprays into both nostrils daily. 10/21/16   Saguier, Percell Miller, PA-C  HYDROCORTISONE ACE, RECTAL, 30 MG SUPP Place 1 suppository (30 mg total) rectally at bedtime as needed. 10/21/16   Saguier, Percell Miller, PA-C  Misc Natural Products (GLUCOSAMINE CHONDROITIN MSM PO) Take by mouth daily. Reported on 02/03/2016    [provider]  OMEGA 3 1000 MG CAPS Take 1 capsule by mouth daily. Reported  on 02/03/2016    [provider]  ondansetron (ZOFRAN ODT) 4 MG disintegrating tablet Take 1 tablet (4 mg total) by mouth every 8 (eight) hours as needed. 08/13/19   Long, Wonda Olds, MD  ondansetron (ZOFRAN) 4 MG tablet Take 1 tablet (4 mg total) by mouth every 8 (eight) hours as needed for nausea or vomiting. 11/14/15   Ward, Delice Bison, DO  SUMAtriptan (IMITREX) 100 MG tablet TAKE 1 TABLET BY MOUTH EVERY 2 HOURS AS NEEDED FOR MIGRAINE 12/04/19   Mosie Lukes, MD  triamterene-hydrochlorothiazide (DYAZIDE) 37.5-25 MG capsule Take 1 capsule by mouth daily as needed. Reported on 02/03/2016 12/26/15   [provider]    Allergies    Latex  Review of Systems   Review of Systems  Constitutional: Negative for  chills and fever.  HENT: Negative for congestion and rhinorrhea.   Eyes: Negative for redness and visual disturbance.  Respiratory: Negative for shortness of breath and wheezing.   Cardiovascular: Negative for chest pain and palpitations.  Gastrointestinal: Positive for nausea and vomiting.  Genitourinary: Negative for dysuria and urgency.  Musculoskeletal: Negative for arthralgias and myalgias.  Skin: Negative for pallor and wound.  Neurological: Positive for headaches. Negative for dizziness.    Physical Exam Updated Vital Signs BP 136/86 (BP Location: Right Arm)   Pulse 72   Temp 98.2 F (36.8 C) (Oral)   Resp 20   Ht 5\' 4"  (1.626 m)   Wt 61.2 kg   LMP 12/14/2016 Comment: Priior the Cycle on 12/14/2016, Patient states that she hadn't had a cycle in at least six months.  SpO2 100%   BMI 23.17 kg/m   Physical Exam Vitals and nursing note reviewed.  Constitutional:      General: She is not in acute distress.    Appearance: She is well-developed. She is not diaphoretic.  HENT:     Head: Normocephalic and atraumatic.  Eyes:     Pupils: Pupils are equal, round, and reactive to light.  Cardiovascular:     Rate and Rhythm: Normal rate and regular rhythm.     Heart sounds: No murmur. No friction rub. No gallop.   Pulmonary:     Effort: Pulmonary effort is normal.     Breath sounds: No wheezing or rales.  Abdominal:     General: There is no distension.     Palpations: Abdomen is soft.     Tenderness: There is no abdominal tenderness.  Musculoskeletal:        General: No tenderness.     Cervical back: Normal range of motion and neck supple.  Skin:    General: Skin is warm and dry.  Neurological:     Mental Status: She is alert and oriented to person, place, and time.     Comments: Benign neuro exam  Psychiatric:        Behavior: Behavior normal.     ED Results / Procedures / Treatments   Labs (all labs ordered are listed, but only abnormal results are  displayed) Labs Reviewed - No data to display  EKG None  Radiology No results found.  Procedures Procedures (including critical care time)  Medications Ordered in ED Medications  dexamethasone (DECADRON) tablet 10 mg (has no administration in time range)  prochlorperazine (COMPAZINE) injection 10 mg (10 mg Intravenous Given 12/20/19 2221)  diphenhydrAMINE (BENADRYL) injection 25 mg (25 mg Intravenous Given 12/20/19 2221)  sodium chloride 0.9 % bolus 1,000 mL (1,000 mLs Intravenous New Bag/Given 12/20/19 2220)  ED Course  I have reviewed the triage vital signs and the nursing notes.  Pertinent labs & imaging results that were available during my care of the patient were reviewed by me and considered in my medical decision making (see chart for details).    MDM Rules/Calculators/A&P                      54 yo F with a cc of a headache.  Going on for the past 5 hours.  Feels likely typical migraine.  Will treat with headache cocktail.   11:04 PM:  I have discussed the diagnosis/risks/treatment options with the patient and believe the pt to be eligible for discharge home to follow-up with PCP. We also discussed returning to the ED immediately if new or worsening sx occur. We discussed the sx which are most concerning (e.g., sudden worsening pain, fever, inability to tolerate by mouth) that necessitate immediate return. Medications administered to the patient during their visit and any new prescriptions provided to the patient are listed below.  Medications given during this visit Medications  dexamethasone (DECADRON) tablet 10 mg (has no administration in time range)  prochlorperazine (COMPAZINE) injection 10 mg (10 mg Intravenous Given 12/20/19 2221)  diphenhydrAMINE (BENADRYL) injection 25 mg (25 mg Intravenous Given 12/20/19 2221)  sodium chloride 0.9 % bolus 1,000 mL (1,000 mLs Intravenous New Bag/Given 12/20/19 2220)     The patient appears reasonably screen and/or stabilized for  discharge and I doubt any other medical condition or other [facility] requiring further screening, evaluation, or treatment in the ED at this time prior to discharge.   Final Clinical Impression(s) / ED Diagnoses Final diagnoses:  Migraine without aura and without status migrainosus, not intractable    Rx / DC Orders ED Discharge Orders    None       Deno Etienne, DO 12/20/19 2304

## 2019-12-20 NOTE — Discharge Instructions (Addendum)
Please let the doctor that follows your headaches know that you had a worsening headache that brought her to the emergency department.  See if they want to change her medications or not.  Please return for worsening headache fever for neck pain.

## 2020-07-31 ENCOUNTER — Emergency Department (HOSPITAL_BASED_OUTPATIENT_CLINIC_OR_DEPARTMENT_OTHER)
Admission: EM | Admit: 2020-07-31 | Discharge: 2020-07-31 | Disposition: A | Discharge status: Home / Self Care | Payer: 59 | Attending: Emergency Medicine | Admitting: Emergency Medicine

## 2020-07-31 ENCOUNTER — Other Ambulatory Visit: Payer: Self-pay

## 2020-07-31 ENCOUNTER — Encounter (HOSPITAL_BASED_OUTPATIENT_CLINIC_OR_DEPARTMENT_OTHER): Payer: Self-pay | Admitting: Emergency Medicine

## 2020-07-31 DIAGNOSIS — G44221 Chronic tension-type headache, intractable: Secondary | ICD-10-CM | POA: Diagnosis present

## 2020-07-31 DIAGNOSIS — Z91040 Latex allergy status: Secondary | ICD-10-CM | POA: Diagnosis not present

## 2020-07-31 DIAGNOSIS — Z85828 Personal history of other malignant neoplasm of skin: Secondary | ICD-10-CM | POA: Diagnosis not present

## 2020-07-31 DIAGNOSIS — G43909 Migraine, unspecified, not intractable, without status migrainosus: Secondary | ICD-10-CM | POA: Diagnosis not present

## 2020-07-31 MED ORDER — DEXAMETHASONE SODIUM PHOSPHATE 10 MG/ML IJ SOLN
10.0000 mg | Freq: Once | INTRAMUSCULAR | Status: AC
  Administered 2020-07-31: 10 mg via INTRAVENOUS
  Filled 2020-07-31: qty 1

## 2020-07-31 MED ORDER — METOCLOPRAMIDE HCL 5 MG/ML IJ SOLN
10.0000 mg | Freq: Once | INTRAMUSCULAR | Status: AC
  Administered 2020-07-31: 10 mg via INTRAVENOUS
  Filled 2020-07-31: qty 2

## 2020-07-31 MED ORDER — SODIUM CHLORIDE 0.9 % IV BOLUS
1000.0000 mL | Freq: Once | INTRAVENOUS | Status: AC
  Administered 2020-07-31: 1000 mL via INTRAVENOUS

## 2020-07-31 MED ORDER — DIPHENHYDRAMINE HCL 50 MG/ML IJ SOLN
50.0000 mg | Freq: Once | INTRAMUSCULAR | Status: AC
  Administered 2020-07-31: 50 mg via INTRAVENOUS
  Filled 2020-07-31: qty 1

## 2020-07-31 NOTE — Discharge Instructions (Addendum)
Please follow up with your PCP regarding your ED visit today Return to the ED for any worsening symptoms including worsening headache, vision changes, speech changes including slurring of speech or difficulty finding words, fevers > 100.4, neck stiffness, rash, or any other new/concerning symptoms

## 2020-07-31 NOTE — ED Provider Notes (Signed)
[facility] EMERGENCY DEPARTMENT Provider Note   CSN: [id number] Arrival date & time: 07/31/20  8563     History Chief Complaint  Patient presents with  . Headache    Nicole Lynch is a 54 y.o. female with PMHx migraines who presents to the ED today with complaint of sudden onset, constant, throbbing, 8/10, left frontal/temporal headache that woke patient up out of her sleep at 4:40 AM this morning. Pt also complains of nausea, 1 episode of NBNB emesis, photophobia, and phonophobia. She has a hx of migraines and states this feels similar. She took zofran and Sumatriptan earlier this morning without relief prompting her to come to the ED today. Pt reports she is having chills however states that she normally gets chills when she is in pain. Denies fevers. No recent sick contacts. No other complaints at this time.   The history is provided by the patient and medical records.       Past Medical History:  Diagnosis Date  . Abdominal bloating 08/16/2017  . Allergy    seasonal  . Chicken pox as a child  . Depression 05   postpartum   . Meniere disease, left 02/03/2016  . Migraine   . Preventative health care 02/03/2016  . Sinusitis acute 06/29/2011  . SOM (serous otitis media) 02/03/2016  . Tick bite of shoulder 02/09/2017    Patient Active Problem List   Diagnosis Date Noted  . Abdominal bloating 08/16/2017  . Preventative health care 02/03/2016  . Meniere disease, left 02/03/2016  . Palpitations 08/31/2014  . Skin cancer 06/29/2011  . Allergic state   . Chicken pox   . Depression   . Migraine     Past Surgical History:  Procedure Laterality Date  . CESAREAN SECTION  2-05, 10-07   X 2  . deviated septum repair  1982     OB History   No obstetric history on file.     Family History  Problem Relation Age of Onset  . Vision loss Mother        macular degeneration  . Heart disease Father        afib on blood thinner  . Cancer Father        prostate  cancer    Social History   Tobacco Use  . Smoking status: Never Smoker  . Smokeless tobacco: Never Used  Vaping Use  . Vaping Use: Never used  Substance Use Topics  . Alcohol use: No    Alcohol/week: 0.0 standard drinks    Comment:    . Drug use: No    Home Medications Prior to Admission medications   Medication Sig Start Date End Date Taking? Authorizing Provider  albuterol (PROVENTIL HFA;VENTOLIN HFA) 108 (90 Base) MCG/ACT inhaler Inhale 2 puffs into the lungs every 6 (six) hours as needed for wheezing or shortness of breath. 10/21/16   Saguier, Percell Miller, PA-C  fluticasone (FLONASE) 50 MCG/ACT nasal spray Place 2 sprays into both nostrils daily. 10/21/16   Saguier, Percell Miller, PA-C  HYDROCORTISONE ACE, RECTAL, 30 MG SUPP Place 1 suppository (30 mg total) rectally at bedtime as needed. 10/21/16   Saguier, Percell Miller, PA-C  Misc Natural Products (GLUCOSAMINE CHONDROITIN MSM PO) Take by mouth daily. Reported on 02/03/2016    [provider]  OMEGA 3 1000 MG CAPS Take 1 capsule by mouth daily. Reported on 02/03/2016    [provider]  ondansetron (ZOFRAN ODT) 4 MG disintegrating tablet Take 1 tablet (4 mg total) by mouth  every 8 (eight) hours as needed. 08/13/19   Long, Wonda Olds, MD  ondansetron (ZOFRAN) 4 MG tablet Take 1 tablet (4 mg total) by mouth every 8 (eight) hours as needed for nausea or vomiting. 11/14/15   Ward, Delice Bison, DO  SUMAtriptan (IMITREX) 100 MG tablet TAKE 1 TABLET BY MOUTH EVERY 2 HOURS AS NEEDED FOR MIGRAINE 12/04/19   Mosie Lukes, MD  triamterene-hydrochlorothiazide (DYAZIDE) 37.5-25 MG capsule Take 1 capsule by mouth daily as needed. Reported on 02/03/2016 12/26/15   [provider]    Allergies    Latex  Review of Systems   Review of Systems  Constitutional: Positive for chills. Negative for fever.  Eyes: Positive for photophobia. Negative for visual disturbance.  Gastrointestinal: Positive for nausea and vomiting. Negative for abdominal pain  and diarrhea.  Musculoskeletal: Negative for neck pain and neck stiffness.  Skin: Negative for rash.  Neurological: Positive for headaches.  All other systems reviewed and are negative.   Physical Exam Updated Vital Signs BP (!) 132/93 (BP Location: Right Arm)   Pulse 74   Temp 98.3 F (36.8 C) (Oral)   Resp 16   Ht 5\' 4"  (1.626 m)   Wt 62.1 kg   LMP 12/14/2016 Comment: Priior the Cycle on 12/14/2016, Patient states that she hadn't had a cycle in at least six months.  SpO2 98%   BMI 23.52 kg/m   Physical Exam Vitals and nursing note reviewed.  Constitutional:      Appearance: She is not ill-appearing.  HENT:     Head: Normocephalic and atraumatic.  Eyes:     Extraocular Movements: Extraocular movements intact.     Conjunctiva/sclera: Conjunctivae normal.     Pupils: Pupils are equal, round, and reactive to light.  Cardiovascular:     Rate and Rhythm: Normal rate and regular rhythm.     Heart sounds: Normal heart sounds.  Pulmonary:     Effort: Pulmonary effort is normal.     Breath sounds: Normal breath sounds. No wheezing, rhonchi or rales.  Abdominal:     Palpations: Abdomen is soft.     Tenderness: There is no abdominal tenderness.  Musculoskeletal:     Cervical back: Neck supple.  Skin:    General: Skin is warm and dry.  Neurological:     Mental Status: She is alert.     Comments: CN 3-12 grossly intact A&O x4 GCS 15 Sensation and strength intact Gait nonataxic including with tandem walking Coordination with finger-to-nose WNL Neg romberg, neg pronator drift     ED Results / Procedures / Treatments   Labs (all labs ordered are listed, but only abnormal results are displayed) Labs Reviewed - No data to display  EKG None  Radiology No results found.  Procedures Procedures (including critical care time)  Medications Ordered in ED Medications  diphenhydrAMINE (BENADRYL) injection 50 mg (50 mg Intravenous Given 07/31/20 1028)  metoCLOPramide  (REGLAN) injection 10 mg (10 mg Intravenous Given 07/31/20 1029)  dexamethasone (DECADRON) injection 10 mg (10 mg Intravenous Given 07/31/20 1029)  sodium chloride 0.9 % bolus 1,000 mL (1,000 mLs Intravenous New Bag/Given 07/31/20 1023)    ED Course  I have reviewed the triage vital signs and the nursing notes.  Pertinent labs & imaging results that were available during my care of the patient were reviewed by me and considered in my medical decision making (see chart for details).    MDM Rules/Calculators/A&P  54 year old female with hx of migraines who presents to the ED today with complaint of migraine headache that began around 4:40 AM this morning with associated nausea, 1 episode of NBNB emesis, photophobia, and phonophobia. Feels like previous migraine HA. Uncontrolled with meds at home. On arrival to the ED pt is afebrile, nontachycardic, and nontachypneic. She does appear uncomfortable s/2 pain. She has no focal neuro deficits or meningeal signs today. Will treat with headache cocktail at this time and reevaluate. Do not feel pt needs labs or imaging at this time.   On reevaluation pt resting comfortably. Reports HA currently 1/10. No more emesis however will plan to fluid challenge prior to discharge.   Pt able to tolerate sprite without difficulty. Stable for discharge at this time. Advised to follow up with PCP. Strict return precautions discussed. She is in agreement with plan and stable for discharge home.   This note was prepared using Dragon voice recognition software and may include unintentional dictation errors due to the inherent limitations of voice recognition software.  Final Clinical Impression(s) / ED Diagnoses Final diagnoses:  Migraine without status migrainosus, not intractable, unspecified migraine type    Rx / DC Orders ED Discharge Orders    None       Discharge Instructions     Please follow up with your PCP regarding your ED  visit today Return to the ED for any worsening symptoms including worsening headache, vision changes, speech changes including slurring of speech or difficulty finding words, fevers > 100.4, neck stiffness, rash, or any other new/concerning symptoms       Eustaquio Maize, PA-C 07/31/20 1204    Maudie Flakes, MD 07/31/20 1511

## 2020-07-31 NOTE — ED Triage Notes (Signed)
Headache since 430am. Hx of migraines and took regular meds without relief. Endorses N/V

## 2020-08-23 ENCOUNTER — Other Ambulatory Visit: Payer: Self-pay | Admitting: Family Medicine

## 2020-10-23 ENCOUNTER — Other Ambulatory Visit: Payer: Self-pay | Admitting: Family Medicine

## 2020-10-23 ENCOUNTER — Encounter: Payer: Self-pay | Admitting: Family Medicine

## 2020-10-23 MED ORDER — ALBUTEROL SULFATE HFA 108 (90 BASE) MCG/ACT IN AERS: 2.0000 | INHALATION_SPRAY | Freq: Four times a day (QID) | RESPIRATORY_TRACT | 0 refills | Status: DC | PRN

## 2020-10-23 MED ORDER — AZITHROMYCIN 250 MG PO TABS: ORAL_TABLET | ORAL | 0 refills | Status: DC

## 2020-10-23 MED ORDER — HYDROCODONE-HOMATROPINE 5-1.5 MG/5ML PO SYRP: 5.0000 mL | ORAL_SOLUTION | Freq: Three times a day (TID) | ORAL | 0 refills | Status: DC | PRN

## 2020-10-23 MED ORDER — FLOVENT HFA 220 MCG/ACT IN AERO: 1.0000 | INHALATION_SPRAY | Freq: Two times a day (BID) | RESPIRATORY_TRACT | 1 refills | Status: DC

## 2020-10-28 ENCOUNTER — Other Ambulatory Visit: Payer: Self-pay | Admitting: Family Medicine

## 2020-10-28 MED ORDER — HYDRALAZINE HCL 10 MG PO TABS: 10.0000 mg | ORAL_TABLET | Freq: Three times a day (TID) | ORAL | 1 refills | Status: DC | PRN

## 2020-10-30 ENCOUNTER — Other Ambulatory Visit: Payer: Self-pay | Admitting: Family Medicine

## 2020-10-30 ENCOUNTER — Telehealth: Payer: Self-pay

## 2020-10-30 MED ORDER — HYDROXYZINE HCL 10 MG PO TABS: 10.0000 mg | ORAL_TABLET | Freq: Three times a day (TID) | ORAL | 1 refills | Status: DC | PRN

## 2020-10-30 NOTE — Telephone Encounter (Signed)
Yes should have been Hydroxyzine, have sent in new prescription and canceled the other. Let pharmacy know and TY

## 2020-10-30 NOTE — Telephone Encounter (Signed)
Initial Comment Caller states they are with the pharmacy and they received a prescription for hydralazine for itching and a rash and they believe it was supposed to be Hydroxyzine. Pharmacy Name [facility] Pharmacist Name Enderlin Number [id number] Translation No   Nurse Assessment Nurse: Lavina Hamman, RN, Thomasena Edis Date/Time Eilene Ghazi Time): 10/29/2020 5:49:55 PM Please select the assessment type ---Pharmacy clarification Additional Documentation ---Hydralazine 10 take 1 tab 3 times for rash or congestion and pharmacy thinks it was supposed to be hydroxyzine. Spoke with Ron Is there an on-call physician for the client? ---No Disp. Time Eilene Ghazi Time) Disposition Final User 10/29/2020 5:59:25 PM Pharmacy Call Lavina Hamman, RN, Thomasena Edis Reason: Told pharamcy to call back when office is open. 10/29/2020 5:59:38 PM Clinical Call Yes Lavina Hamman, RN, Thomasena Edis

## 2020-10-30 NOTE — Telephone Encounter (Signed)
There was another message sent about this today.

## 2020-10-30 NOTE — Telephone Encounter (Signed)
done

## 2020-11-07 ENCOUNTER — Ambulatory Visit: Payer: 59 | Admitting: Family Medicine

## 2021-01-27 ENCOUNTER — Encounter: Payer: Self-pay | Admitting: Neurology

## 2021-02-10 ENCOUNTER — Other Ambulatory Visit: Payer: Self-pay

## 2021-02-10 ENCOUNTER — Encounter (HOSPITAL_BASED_OUTPATIENT_CLINIC_OR_DEPARTMENT_OTHER): Payer: Self-pay

## 2021-02-10 ENCOUNTER — Emergency Department (HOSPITAL_BASED_OUTPATIENT_CLINIC_OR_DEPARTMENT_OTHER)
Admission: EM | Admit: 2021-02-10 | Discharge: 2021-02-11 | Disposition: A | Discharge status: Home / Self Care | Payer: 59 | Attending: Emergency Medicine | Admitting: Emergency Medicine

## 2021-02-10 DIAGNOSIS — I10 Essential (primary) hypertension: Secondary | ICD-10-CM | POA: Diagnosis not present

## 2021-02-10 DIAGNOSIS — I7300 Raynaud's syndrome without gangrene: Secondary | ICD-10-CM | POA: Diagnosis not present

## 2021-02-10 DIAGNOSIS — Z91040 Latex allergy status: Secondary | ICD-10-CM | POA: Insufficient documentation

## 2021-02-10 DIAGNOSIS — R42 Dizziness and giddiness: Secondary | ICD-10-CM

## 2021-02-10 NOTE — ED Triage Notes (Signed)
Pt here POV from Home with Dizziness.   Dizziness began today at 2030. No Fall or Syncope.   EMS responded at Home and BP was high (121F systolic).   GCS 15. No Obvious Neurological Deficits. No recent Trauma

## 2021-02-11 LAB — BASIC METABOLIC PANEL
Anion gap: <5 — ABNORMAL LOW (ref 5–15)
BUN: 21 mg/dL — ABNORMAL HIGH (ref 6–20)
CO2: 35 mmol/L — ABNORMAL HIGH (ref 22–32)
Calcium: 10.1 mg/dL (ref 8.9–10.3)
Chloride: 102 mmol/L (ref 98–111)
Creatinine, Ser: 0.97 mg/dL (ref 0.44–1.00)
GFR, Estimated: >60 mL/min (ref >60)
Glucose, Bld: 87 mg/dL (ref 70–99)
Potassium: 3.8 mmol/L (ref 3.5–5.1)
Sodium: 138 mmol/L (ref 135–145)

## 2021-02-11 LAB — URINALYSIS, ROUTINE W REFLEX MICROSCOPIC
Bilirubin Urine: NEGATIVE
Glucose, UA: NEGATIVE mg/dL
Ketones, ur: NEGATIVE mg/dL
Nitrite: NEGATIVE
Protein, ur: NEGATIVE mg/dL
Specific Gravity, Urine: 1.01 (ref 1.005–1.030)
pH: 6.5 (ref 5.0–8.0)

## 2021-02-11 LAB — CBC
HCT: 41.8 % (ref 36.0–46.0)
Hemoglobin: 13.8 g/dL (ref 12.0–15.0)
MCH: 32.3 pg (ref 26.0–34.0)
MCHC: 33 g/dL (ref 30.0–36.0)
MCV: 97.9 fL (ref 80.0–100.0)
Platelets: 224 10*3/uL (ref 150–400)
RBC: 4.27 MIL/uL (ref 3.87–5.11)
RDW: 12.1 % (ref 11.5–15.5)
WBC: 6.3 10*3/uL (ref 4.0–10.5)
nRBC: 0 % (ref 0.0–0.2)

## 2021-02-11 LAB — CBG MONITORING, ED: Glucose-Capillary: 83 mg/dL (ref 70–99)

## 2021-02-11 NOTE — ED Provider Notes (Signed)
[facility] Provider Note  CSN: [id number] Arrival date & time: 02/10/21 2208    History Chief Complaint  Patient presents with  . Dizziness    HPI  Nicole Lynch is a 55 y.o. female with history of meniere's disease, symptoms are typically tinnitus and fullness in her left ear but never any vertiginous symptoms, managed with low salt diet and triamterene/HCTZ which she takes twice a week. She reports her baseline BP is in the low range (110-120) but earlier today she began feeling 'off', with some mild dizziness/lightheadedness and was concerned her BP had gotten lower however when she checked it at home, it was elevated to ~170/90, she went to a nearby fire station where they confirmed the reading was high but when she stood up it dropped to 140s. She denies any CP, SOB, blurry vision, nausea, vomiting. She is feeling better while lying in the ED bed. She also reports occasionally the tips of her fingers will turn white and feel cold. This first happened while camping in [place] weather. She attributes this to her BP being low, but has never measured her BP while having those symptoms. Fingers return to normal with warming.    Past Medical History:  Diagnosis Date  . Abdominal bloating 08/16/2017  . Allergy    seasonal  . Chicken pox as a child  . Depression 3   postpartum   . Meniere disease, left 02/03/2016  . Migraine   . Preventative health care 02/03/2016  . Sinusitis acute 06/29/2011  . SOM (serous otitis media) 02/03/2016  . Tick bite of shoulder 02/09/2017    Past Surgical History:  Procedure Laterality Date  . CESAREAN SECTION  2-05, 10-07   X 2  . deviated septum repair  1982    Family History  Problem Relation Age of Onset  . Vision loss Mother        macular degeneration  . Heart disease Father        afib on blood thinner  . Cancer Father        prostate cancer    Social History   Tobacco Use  . Smoking status: Never  Smoker  . Smokeless tobacco: Never Used  Vaping Use  . Vaping Use: Never used  Substance Use Topics  . Alcohol use: No    Alcohol/week: 0.0 standard drinks    Comment:    . Drug use: No     Home Medications Prior to Admission medications   Medication Sig Start Date End Date Taking? Authorizing Provider  albuterol (VENTOLIN HFA) 108 (90 Base) MCG/ACT inhaler Inhale 2 puffs into the lungs every 6 (six) hours as needed for wheezing or shortness of breath. 10/23/20   Mosie Lukes, MD  azithromycin (ZITHROMAX) 250 MG tablet 2 tabs po once and then 1 tab po daily x 4 days 10/23/20   Mosie Lukes, MD  fluticasone [facility]) 50 MCG/ACT nasal spray Place 2 sprays into both nostrils daily. 10/21/16   Saguier, Percell Miller, PA-C  fluticasone (FLOVENT HFA) 220 MCG/ACT inhaler Inhale 1 puff into the lungs in the morning and at bedtime. 10/23/20   Mosie Lukes, MD  HYDROcodone-homatropine [facility]) 5-1.5 MG/5ML syrup Take 5 mLs by mouth every 8 (eight) hours as needed for cough. 10/23/20   Mosie Lukes, MD  HYDROCORTISONE ACE, RECTAL, 30 MG SUPP Place 1 suppository (30 mg total) rectally at bedtime as needed. 10/21/16   Saguier, Percell Miller, PA-C  hydrOXYzine (ATARAX/VISTARIL) 10 MG tablet  Take 1 tablet (10 mg total) by mouth 3 (three) times daily as needed for itching. 10/30/20   Mosie Lukes, MD  Misc Natural Products (GLUCOSAMINE CHONDROITIN MSM PO) Take by mouth daily. Reported on 02/03/2016    [provider]  OMEGA 3 1000 MG CAPS Take 1 capsule by mouth daily. Reported on 02/03/2016    [provider]  ondansetron (ZOFRAN ODT) 4 MG disintegrating tablet Take 1 tablet (4 mg total) by mouth every 8 (eight) hours as needed. 08/13/19   Long, Wonda Olds, MD  ondansetron (ZOFRAN) 4 MG tablet Take 1 tablet (4 mg total) by mouth every 8 (eight) hours as needed for nausea or vomiting. 11/14/15   Ward, Delice Bison, DO  SUMAtriptan (IMITREX) 100 MG tablet Take 1 tablet (100 mg total) by mouth daily as needed  for migraine. May repeat dose in 2 hours if no relief.  Do not exceed 2 doses in 24 hours. 08/23/20   Mosie Lukes, MD  triamterene-hydrochlorothiazide (DYAZIDE) 37.5-25 MG capsule Take 1 capsule by mouth daily as needed. Reported on 02/03/2016 12/26/15   [provider]     Allergies    Latex   Review of Systems   Review of Systems A comprehensive review of systems was completed and negative except as noted in HPI.    Physical Exam BP (!) 135/93   Pulse 72   Temp 98 F (36.7 C) (Oral)   Resp 15   Ht 5\' 6"  (1.676 m)   Wt 60.3 kg   LMP 12/14/2016 Comment: Priior the Cycle on 12/14/2016, Patient states that she hadn't had a cycle in at least six months.  SpO2 100%   BMI 21.47 kg/m   Physical Exam Vitals and nursing note reviewed.  Constitutional:      Appearance: Normal appearance.  HENT:     Head: Normocephalic and atraumatic.     Right Ear: Tympanic membrane normal.     Left Ear: Tympanic membrane normal.     Nose: Nose normal.     Mouth/Throat:     Mouth: Mucous membranes are moist.  Eyes:     Extraocular Movements: Extraocular movements intact.     Conjunctiva/sclera: Conjunctivae normal.  Cardiovascular:     Rate and Rhythm: Normal rate.  Pulmonary:     Effort: Pulmonary effort is normal.     Breath sounds: Normal breath sounds.  Abdominal:     General: Abdomen is flat.     Palpations: Abdomen is soft.     Tenderness: There is no abdominal tenderness.  Musculoskeletal:        General: No swelling. Normal range of motion.     Cervical back: Neck supple.  Skin:    General: Skin is warm and dry.  Neurological:     General: No focal deficit present.     Mental Status: She is alert.  Psychiatric:        Mood and Affect: Mood normal.      ED Results / Procedures / Treatments   Labs (all labs ordered are listed, but only abnormal results are displayed) Labs Reviewed  BASIC METABOLIC PANEL - Abnormal; Notable for the following components:       Result Value   CO2 35 (*)    BUN 21 (*)    Anion gap 1 (*)    All other components within normal limits  URINALYSIS, ROUTINE W REFLEX MICROSCOPIC - Abnormal; Notable for the following components:   Color, Urine COLORLESS (*)  Hgb urine dipstick TRACE (*)    Leukocytes,Ua SMALL (*)    All other components within normal limits  CBC  CBG MONITORING, ED    EKG EKG Interpretation  Date/Time:  Monday February 10 2021 23:59:11 EDT Ventricular Rate:  60 PR Interval:  209 QRS Duration: 92 QT Interval:  430 QTC Calculation: 430 R Axis:   56 Text Interpretation: Duplicate Confirmed by Calvert Cantor [phone]) on 02/11/2021 12:24:29 AM    Radiology No results found.  Procedures Procedures  Medications Ordered in the ED Medications - No data to display   MDM Rules/Calculators/A&P MDM Patient with non-specific dizziness associated with an increase in BP above baseline. She is feeling better with BP lower. No prior history of HTN and is on Dyazide for Meniere's. Also having symptoms that sound like Raynaud's but has had a previous extensive rheumatologic workup which was negative. Will check labs, continue to monitor in the ED.  ED Course  I have reviewed the triage vital signs and the nursing notes.  Pertinent labs & imaging results that were available during my care of the patient were reviewed by me and considered in my medical decision making (see chart for details).  Clinical Course as of 02/11/21 0125  Tue Feb 11, 2021  0049 UA, CBC normal.  [CS]  0100 BMP shows mildly elevated CO2 of unclear significance.  [CS]  0123 Patient's labs are unremarkable here. BP is only mildly elevated and her symptoms seem to be resolved. Recommend she keep a log of BP. If she notices a persistently elevated reading, I recommend she take her Dyazide more frequently than twice a week and follow up with her PCP.  [CS]    Clinical Course User Index [CS] Truddie Hidden, MD    Final Clinical  Impression(s) / ED Diagnoses Final diagnoses:  Hypertension, unspecified type  Dizziness  Raynaud's phenomenon without gangrene    Rx / DC Orders ED Discharge Orders    None       Truddie Hidden, MD 02/11/21 [phone]

## 2021-02-21 NOTE — Progress Notes (Signed)
NEUROLOGY CONSULTATION NOTE  [name] MRN: [id number] DOB: 10-31-1965   Referring provider: Vicie Mutters, MD Primary care provider: Penni Homans, MD  Reason for consult:  migraines  Assessment/Plan:   1.  Migraine without aura, without status migrainosus, intractable 2.  Left-sided Meniere's disease  1.  Defer preventative medication for now. 2.  At earliest onset of migraine, she will take Nurtec daily for 3 days (the typical duration of her migraines).  Zofran for nausea.   3.  Limit use of pain relievers to no more than 2 days out of week to prevent risk of rebound or medication-overuse headache. 4.  Keep headache diary 5.  Follow up in 6 months.   Subjective:  Nicole Lynch is a 55 year old right-handed female who presents for migraines.  History supplemented by referring provider's notes.    She started having migraines in high school.  They resolved during the pregnancy of her first son 2004.  They returned around 2012 when she sustained a concussion after being hit on the left side of her head with a softball, sustaining a tooth fracture.  She describes a severe left sided non-throbbing headache that sometimes travels across her forehead with stabbing in her left ear.  There is associated nausea, vomiting, left ptosis, photophobia, phonophobia but no visual disturbance, numbness or weakness.  They typically last 3 days and occur every 2 months (sometimes more frequent).  Triggers include overheating and dust.  She typically treats with sumatriptan but headache returns the next day for 3 days.   Once in awhile, she needs to go to the ED or UC for headache cocktail.  Following the concussion, she developed left-sided Meniere's disease, consisting of left sided aura fullness and high-pitched tinnitus.  She was found to have mild low-frequency sensorineural hearing loss and started on Dyazide and low-sodium diet.  In 2021, she began having worsening symptoms with constant  high-pitched tinnitus, hearing loss and aural fullness in the left ear, which she associated with receiving the first dose of the [place] vaccine.  No associated vertigo.  Repeat audiogram now showed new moderate high-frequency hearing loss in the left ear with significant decreased hearing compared to 2017.  Right ear was normal.  She has history of migraines which have also worsened at the same time.  MRI of brain with and without contrast on 03/26/2020 was unremarkable with no vestibular schwannoma, endolymphatic sac abnormality or abnormality of the brain.  She had a positive ANA, but sed rate, TSH, and ANCA panel were unremarkable.    Rescue protocol:  Sumatriptan first line, Excedrin second line. Current NSAIDS/analgesics:  Excedrin Migraine Current triptans:  Sumatriptan 100mg  Current ergotamine:  none Current anti-emetic:  none Current muscle relaxants:  none Current Antihypertensive medications:  Dyazide Current Antidepressant medications:  none Current Anticonvulsant medications:  none Current anti-CGRP:  none Current Vitamins/Herbal/Supplements:  none Current Antihistamines/Decongestants:  none Other therapy:  ice Hormone/birth control:  none  Past NSAIDS/analgesics:  Hydrocodone-homatropine Past abortive triptans:  none Past abortive ergotamine:  none Past muscle relaxants:  none Past anti-emetic:  Zofran ODT 4mg  Past antihypertensive medications:  none Past antidepressant medications:  none Past anticonvulsant medications:  none Past anti-CGRP:  Ubrelvy, Nurtec (rescue - took a sample-does not remember if effective) Past vitamins/Herbal/Supplements:  none Past antihistamines/decongestants:  Flonase, hydroxyzine Other past therapies:  none  Caffeine:  no Diet:  Has increased water intake.  Does not skip meals Exercise:  Not routine Depression:  mild Other pain:  knees Sleep hygiene:  good Family history of headache:  Son, father   PAST MEDICAL HISTORY: Past  Medical History:  Diagnosis Date  . Abdominal bloating 08/16/2017  . Allergy    seasonal  . Chicken pox as a child  . Depression 05   postpartum   . Meniere disease, left 02/03/2016  . Migraine   . Preventative health care 02/03/2016  . Sinusitis acute 06/29/2011  . SOM (serous otitis media) 02/03/2016  . Tick bite of shoulder 02/09/2017    PAST SURGICAL HISTORY: Past Surgical History:  Procedure Laterality Date  . CESAREAN SECTION  2-05, 10-07   X 2  . deviated septum repair  1982    MEDICATIONS: Current Outpatient Medications on File Prior to Visit  Medication Sig Dispense Refill  . albuterol (VENTOLIN HFA) 108 (90 Base) MCG/ACT inhaler Inhale 2 puffs into the lungs every 6 (six) hours as needed for wheezing or shortness of breath. 1 each 0  . azithromycin (ZITHROMAX) 250 MG tablet 2 tabs po once and then 1 tab po daily x 4 days 6 tablet 0  . fluticasone (FLONASE) 50 MCG/ACT nasal spray Place 2 sprays into both nostrils daily. 16 g 1  . fluticasone (FLOVENT HFA) 220 MCG/ACT inhaler Inhale 1 puff into the lungs in the morning and at bedtime. 1 each 1  . HYDROcodone-homatropine (HYCODAN) 5-1.5 MG/5ML syrup Take 5 mLs by mouth every 8 (eight) hours as needed for cough. 120 mL 0  . HYDROCORTISONE ACE, RECTAL, 30 MG SUPP Place 1 suppository (30 mg total) rectally at bedtime as needed. 28 each 1  . hydrOXYzine (ATARAX/VISTARIL) 10 MG tablet Take 1 tablet (10 mg total) by mouth 3 (three) times daily as needed for itching. 40 tablet 1  . Misc Natural Products (GLUCOSAMINE CHONDROITIN MSM PO) Take by mouth daily. Reported on 02/03/2016    . OMEGA 3 1000 MG CAPS Take 1 capsule by mouth daily. Reported on 02/03/2016    . ondansetron (ZOFRAN ODT) 4 MG disintegrating tablet Take 1 tablet (4 mg total) by mouth every 8 (eight) hours as needed. 20 tablet 0  . ondansetron (ZOFRAN) 4 MG tablet Take 1 tablet (4 mg total) by mouth every 8 (eight) hours as needed for nausea or vomiting. 20 tablet 0  .  SUMAtriptan (IMITREX) 100 MG tablet Take 1 tablet (100 mg total) by mouth daily as needed for migraine. May repeat dose in 2 hours if no relief.  Do not exceed 2 doses in 24 hours. 10 tablet 2  . triamterene-hydrochlorothiazide (DYAZIDE) 37.5-25 MG capsule Take 1 capsule by mouth daily as needed. Reported on 02/03/2016  1   No current facility-administered medications on file prior to visit.    ALLERGIES: Allergies  Allergen Reactions  . Latex Itching    FAMILY HISTORY: Family History  Problem Relation Age of Onset  . Vision loss Mother        macular degeneration  . Heart disease Father        afib on blood thinner  . Cancer Father        prostate cancer    Objective:  Blood pressure 136/88, pulse 66, height 5\' 5"  (1.651 m), weight 134 lb 3.2 oz (60.9 kg), last menstrual period 12/14/2016, SpO2 100 %. General: No acute distress.  Patient appears well-groomed.   Head:  Normocephalic/atraumatic Eyes:  fundi examined but not visualized Neck: supple, no paraspinal tenderness, full range of motion Back: No paraspinal tenderness Heart: regular rate and rhythm Lungs: Clear  to auscultation bilaterally. Vascular: No carotid bruits. Neurological Exam: Mental status: alert and oriented to person, place, and time, recent and remote memory intact, fund of knowledge intact, attention and concentration intact, speech fluent and not dysarthric, language intact. Cranial nerves: CN I: not tested CN II: pupils equal, round and reactive to light, visual fields intact CN III, IV, VI:  full range of motion, no nystagmus, no ptosis CN V: facial sensation intact. CN VII: upper and lower face symmetric CN VIII: hearing intact CN IX, X: gag intact, uvula midline CN XI: sternocleidomastoid and trapezius muscles intact CN XII: tongue midline Bulk & Tone: normal, no fasciculations. Motor:  muscle strength 5/5 throughout Sensation:  Pinprick, temperature and vibratory sensation intact. Deep Tendon  Reflexes:  2+ throughout,  toes downgoing.   Finger to nose testing:  Without dysmetria.   Heel to shin:  Without dysmetria.   Gait:  Normal station and stride.  Romberg negative.    Thank you for allowing me to take part in the care of this patient.  Metta Clines, DO  CC:  Penni Homans, MD  Vicie Mutters, MD

## 2021-02-24 ENCOUNTER — Other Ambulatory Visit: Payer: Self-pay

## 2021-02-24 ENCOUNTER — Encounter: Payer: Self-pay | Admitting: Neurology

## 2021-02-24 ENCOUNTER — Ambulatory Visit (INDEPENDENT_AMBULATORY_CARE_PROVIDER_SITE_OTHER): Payer: 59 | Admitting: Neurology

## 2021-02-24 VITALS — BP 136/88 | HR 66 | Ht 65.0 in | Wt 134.2 lb

## 2021-02-24 DIAGNOSIS — G43019 Migraine without aura, intractable, without status migrainosus: Secondary | ICD-10-CM | POA: Diagnosis not present

## 2021-02-24 MED ORDER — ONDANSETRON 4 MG PO TBDP: 4.0000 mg | ORAL_TABLET | Freq: Three times a day (TID) | ORAL | 5 refills | Status: AC | PRN

## 2021-02-24 NOTE — Patient Instructions (Signed)
  1. Take Nurtec 1 daily for 3 days when you get a migraine.  Take ondansetron for nausea 2. Limit use of pain relievers to no more than 2 days out of the week.  These medications include acetaminophen, NSAIDs (ibuprofen/Advil/Motrin, naproxen/Aleve, triptans (Imitrex/sumatriptan), Excedrin, and narcotics.  This will help reduce risk of rebound headaches. 3. Be aware of common food triggers:  - Caffeine:  coffee, black tea, cola, Mt. Dew  - Chocolate  - Dairy:  aged cheeses (brie, blue, cheddar, gouda, [place], provolone, [place], Swiss, etc), chocolate milk, buttermilk, sour cream, limit eggs and yogurt  - Nuts, peanut butter  - Alcohol  - Cereals/grains:  FRESH breads (fresh bagels, sourdough, doughnuts), yeast productions  - Processed/canned/aged/cured meats (pre-packaged deli meats, hotdogs)  - MSG/glutamate:  soy sauce, flavor enhancer, pickled/preserved/marinated foods  - Sweeteners:  aspartame (Equal, Nutrasweet).  Sugar and Splenda are okay  - Vegetables:  legumes (lima beans, lentils, snow peas, fava beans, pinto peans, peas, garbanzo beans), sauerkraut, onions, olives, pickles  - Fruit:  avocados, bananas, citrus fruit (orange, lemon, grapefruit), mango  - Other:  Frozen meals, macaroni and cheese 4. Routine exercise 5. Stay adequately hydrated (aim for 64 oz water daily) 6. Keep headache diary 7. Maintain proper stress management 8. Maintain proper sleep hygiene 9. Do not skip meals 10. Consider supplements:  magnesium citrate 400mg  daily, riboflavin 400mg  daily, coenzyme Q10 100mg  three times daily.

## 2021-03-18 ENCOUNTER — Telehealth: Payer: Self-pay | Admitting: Neurology

## 2021-03-18 MED ORDER — NURTEC 75 MG PO TBDP: 75.0000 mg | ORAL_TABLET | ORAL | 5 refills | Status: DC | PRN

## 2021-03-18 NOTE — Telephone Encounter (Signed)
Patient called in stating she had tried the Nurtec oral disintegrating tablet samples and they worked well. She would like a prescription for them [facility] to [facility] [employer] [facility] [address]

## 2021-04-01 NOTE — Telephone Encounter (Signed)
Per pt she needs a PA Nurtec.

## 2021-04-01 NOTE — Telephone Encounter (Signed)
Medication Samples have been provided to the patient.  Drug name: Nurtec       Strength: 75 mg        Qty: 2  LOT: [id number]  Exp.Date: 06/2023  Dosing instructions:   The patient has been instructed regarding the correct time, dose, and frequency of taking this medication, including desired effects and most common side effects.   Venetia Night 12:16 PM 04/01/2021

## 2021-04-01 NOTE — Telephone Encounter (Signed)
Patient called again regarding her medicine, Nurtec. She has questions regarding the refills. Is it the insurance or submission? [facility]

## 2021-04-02 ENCOUNTER — Telehealth: Payer: Self-pay | Admitting: Neurology

## 2021-04-02 NOTE — Telephone Encounter (Signed)
Opened in error

## 2021-04-04 ENCOUNTER — Telehealth: Payer: Self-pay | Admitting: Neurology

## 2021-04-04 NOTE — Telephone Encounter (Signed)
Nurtec approved effective 04/02/2021 - 04/01/2022 authorization letter  sent to scan

## 2021-04-04 NOTE — Telephone Encounter (Signed)
    Nurtec 75MG  dispersible tablets     Outcome: Approved  Created: June 14th, 2022  Sent: June 14th, 2022

## 2021-04-22 ENCOUNTER — Ambulatory Visit (INDEPENDENT_AMBULATORY_CARE_PROVIDER_SITE_OTHER): Payer: 59 | Admitting: Family Medicine

## 2021-04-22 ENCOUNTER — Other Ambulatory Visit: Payer: Self-pay

## 2021-04-22 VITALS — BP 120/78 | HR 87 | Temp 98.7°F | Resp 16 | Ht 65.0 in | Wt 132.4 lb

## 2021-04-22 DIAGNOSIS — G8929 Other chronic pain: Secondary | ICD-10-CM

## 2021-04-22 DIAGNOSIS — R300 Dysuria: Secondary | ICD-10-CM | POA: Diagnosis not present

## 2021-04-22 DIAGNOSIS — R350 Frequency of micturition: Secondary | ICD-10-CM

## 2021-04-22 DIAGNOSIS — H8102 Meniere's disease, left ear: Secondary | ICD-10-CM

## 2021-04-22 DIAGNOSIS — Z8616 Personal history of COVID-19: Secondary | ICD-10-CM | POA: Insufficient documentation

## 2021-04-22 DIAGNOSIS — M545 Low back pain, unspecified: Secondary | ICD-10-CM

## 2021-04-22 DIAGNOSIS — Z0000 Encounter for general adult medical examination without abnormal findings: Secondary | ICD-10-CM

## 2021-04-22 DIAGNOSIS — M25561 Pain in right knee: Secondary | ICD-10-CM

## 2021-04-22 DIAGNOSIS — M25562 Pain in left knee: Secondary | ICD-10-CM | POA: Insufficient documentation

## 2021-04-22 DIAGNOSIS — Z1211 Encounter for screening for malignant neoplasm of colon: Secondary | ICD-10-CM

## 2021-04-22 NOTE — Patient Instructions (Addendum)
For rash after bugbite, Zyrtec/Cetirizine 10 mg twice a day and Pepcid/Famotidine 20 mg twice daily  [email or web address] for testing   Hives Hives are itchy, red, swollen areas on your skin. Hives can show up on any part of your body. Hives often fade within 24 hours (acute hives). New hives can show up after old ones fade. This can go on for many days or weeks (chronic hives). Hives do not spread from person to person (are not contagious). Hives are caused by your body's response to something that you are allergic to (allergen). These are sometimes called triggers. You can get hives right after beingaround a trigger, or hours later. What are the causes? Allergies to foods. Insect bites or stings. Pollen. Pets. Latex. Chemicals. Spending time in sunlight, heat, or cold. Exercise. Stress. Some medicines. Viruses. This includes the common cold. Infections caused by germs (bacteria). Allergy shots. Blood transfusions. Sometimes, the cause is not known. What increases the risk? Being a woman. Being allergic to foods such as: Citrus fruits. Milk. Eggs. Peanuts. Tree nuts. Shellfish. Being allergic to: Medicines. Latex. Insects. Animals. Pollen. What are the signs or symptoms?  Raised, itchy, red or white bumps or patches on your skin. These areas may: Get large and swollen. Change in shape and location. Stand alone or connect to each other over a large area of skin. Sting or hurt. Turn white when pressed in the center (blanch). In very bad cases, your hands, feet, and face may also get swollen. This mayhappen if hives start deeper in your skin. How is this treated? Treatment for this condition depends on your symptoms. Treatment may include: Using cool, wet cloths (cool compresses) or taking cool showers to stop the itching. Medicines that help: Relieve itching (antihistamines). Reduce swelling (corticosteroids). Treat infection (antibiotics). A medicine (omalizumab) that  is given as a shot (injection). Your doctor may prescribe this if you have hives that do not get better even after other treatments. In very bad cases, you may need a shot of a medicine called epinephrine to prevent a life-threatening allergic reaction (anaphylaxis). Follow these instructions at home: Medicines Take or apply over-the-counter and prescription medicines only as told by your doctor. If you were prescribed an antibiotic medicine, use it as told by your doctor. Do not stop using it even if you start to feel better. Skin care Apply cool, wet cloths to the hives. Do not scratch your skin. Do not rub your skin. General instructions Do not take hot showers or baths. This can make itching worse. Do not wear tight clothes. Use sunscreen and wear clothes that cover your skin when you are outside. Avoid any triggers that cause your hives. Keep a journal to help track what causes your hives. Write down: What medicines you take. What you eat and drink. What products you use on your skin. Keep all follow-up visits as told by your doctor. This is important. Contact a doctor if: Your symptoms are not better with medicine. Your joints hurt or are swollen. Get help right away if: You have a fever. You have pain in your belly (abdomen). Your tongue or lips are swollen. Your eyelids are swollen. Your chest or throat feels tight. You have trouble breathing or swallowing. These symptoms may be an emergency. Do not wait to see if the symptoms will go away. Get medical help right away. Call your local emergency services (911 in the [place] Do not drive yourself to the hospital. Summary Hives are itchy, red, swollen areas on  your skin. Treatment for this condition depends on your symptoms. Avoid things that cause your hives. Keep a journal to help track what causes your hives. Take and apply over-the-counter and prescription medicines only as told by your doctor. Keep all follow-up visits as  told by your doctor. This is important. This information is not intended to replace advice given to you by your health care provider. Make sure you discuss any questions you have with your healthcare provider. Document Revised: 11/24/2019 Document Reviewed: 04/20/2018 Elsevier Patient Education  [place].

## 2021-04-22 NOTE — Assessment & Plan Note (Signed)
She had an infection in January

## 2021-04-22 NOTE — Progress Notes (Signed)
Patient ID: Nicole Lynch, female    DOB: Mar 11, 1966  Age: 55 y.o. MRN: [id number]    Subjective:  Subjective  HPI Nicole Lynch presents for office visit today for comprehensive physical exam today and follow up on management of chronic concerns. She states that she has been experiencing back pain, but reports that exercising once a week helps alleviate some of the pain. She states that when especially stretching, she feels relief.She denies CP/palp/SOB/HA/congestion/fevers or GI c/o. Taking meds as prescribed.  She states that her knees over the last year have been giving her trouble especially when she is sitting down. She states that she experiences pain behind her left knee that gets uncomfortable. She denies any swelling behind the left knee. Hip feels like it is catching last couple of months and denies any recent injuries or falls on hip.  She states that recently at work she has been standing more frequently due to shortage and reports that Dyazide 37.5 mg prn has helped with her edema.  She states that she is getting more allergic to bites and stings. She states that she has had a bite 2 weeks ago that felt very itchy. She endorses being able to breath, but reports feeling fatigue when she initially got bit.  She reports that she recently had a visit to the ED due to feeling lightheaded and having a systolic BP of 195. She states that they were not able to find anything significant at the ED and states that she does not miss meals.  She reports that on the first week of January she has had a COVID-19 infection. She states feeling fatigue, but this might be due to her drinking black tea yesterday which kept her up last night. She reports that when she took her last COVID-19 shot, she experienced tinnitus in left year. She states that she went to her ENT Dr. 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 She reports feeling concern that it might be due to her taking  the vaccination. She describes the tinnitus as a popping metallic and high-pitched noise. She endorses cutting some salt from her diet. She states that she has been considering a hearing aid, but states concern that it might worsen her hearing loss/tinnitus.  She states that she might currently have a UTI. She states that she experiences dysuria, urgency, and itching. She reports feeling body aches, but she states that this is usually her baseline. She denies any flank pain, back pain, or abdominal pain.  She states that she has been dealing with depression lately due to her son [name] experiencing a concussion that had caused him change in personality which is adding stress and affecting her.  Review of Systems  Constitutional:  Positive for fatigue. Negative for chills and fever.  HENT:  Positive for hearing loss and tinnitus. Negative for congestion, rhinorrhea, sinus pressure, sinus pain and sore throat.   Eyes:  Negative for pain.  Respiratory:  Negative for cough and shortness of breath.   Cardiovascular:  Negative for chest pain, palpitations and leg swelling.  Gastrointestinal:  Negative for abdominal pain, blood in stool, diarrhea, nausea and vomiting.  Genitourinary:  Positive for dysuria and urgency. Negative for decreased urine volume, flank pain, frequency, vaginal bleeding and vaginal discharge.       (+) itching  Musculoskeletal:  Positive for arthralgias and back pain.  Skin:  Positive for rash.  Neurological:  Negative for headaches.  History Past Medical History:  Diagnosis Date   Abdominal bloating 08/16/2017   Allergy    seasonal   Chicken pox as a child   Depression 3   postpartum    Meniere disease, left 02/03/2016   Migraine    Preventative health care 02/03/2016   Sinusitis acute 06/29/2011   SOM (serous otitis media) 02/03/2016   Tick bite of shoulder 02/09/2017    She has a past surgical history that includes Cesarean section (2-05, 10-07) and deviated  septum repair (1982).   Her family history includes Cancer in her father; Heart disease in her father; Vision loss in her mother.She reports that she has never smoked. She has never used smokeless tobacco. She reports that she does not drink alcohol and does not use drugs.  Current Outpatient Medications on File Prior to Visit  Medication Sig Dispense Refill   albuterol (VENTOLIN HFA) 108 (90 Base) MCG/ACT inhaler Inhale 2 puffs into the lungs every 6 (six) hours as needed for wheezing or shortness of breath. 1 each 0   fluticasone (FLONASE) 50 MCG/ACT nasal spray Place 2 sprays into both nostrils daily. 16 g 1   fluticasone (FLOVENT HFA) 220 MCG/ACT inhaler Inhale 1 puff into the lungs in the morning and at bedtime. 1 each 1   HYDROcodone-homatropine (HYCODAN) 5-1.5 MG/5ML syrup Take 5 mLs by mouth every 8 (eight) hours as needed for cough. 120 mL 0   HYDROCORTISONE ACE, RECTAL, 30 MG SUPP Place 1 suppository (30 mg total) rectally at bedtime as needed. 28 each 1   OMEGA 3 1000 MG CAPS Take 1 capsule by mouth daily. Reported on 02/03/2016     ondansetron (ZOFRAN ODT) 4 MG disintegrating tablet Take 1 tablet (4 mg total) by mouth every 8 (eight) hours as needed for nausea or vomiting. 20 tablet 5   ondansetron (ZOFRAN) 4 MG tablet Take 1 tablet (4 mg total) by mouth every 8 (eight) hours as needed for nausea or vomiting. 20 tablet 0   Rimegepant Sulfate (NURTEC) 75 MG TBDP Take 75 mg by mouth as needed (take 1 tab at the earlist onset of  migraine max 1 tab in 24 hours). 8 tablet 5   SUMAtriptan (IMITREX) 100 MG tablet Take 1 tablet (100 mg total) by mouth daily as needed for migraine. May repeat dose in 2 hours if no relief.  Do not exceed 2 doses in 24 hours. 10 tablet 2   triamterene-hydrochlorothiazide (DYAZIDE) 37.5-25 MG capsule Take 1 capsule by mouth daily as needed. Reported on 02/03/2016  1   No current facility-administered medications on file prior to visit.     Objective:  Objective   Physical Exam Constitutional:      General: She is not in acute distress.    Appearance: Normal appearance. She is not ill-appearing or toxic-appearing.  HENT:     Head: Normocephalic and atraumatic.     Right Ear: Tympanic membrane, ear canal and external ear normal.     Left Ear: Tympanic membrane, ear canal and external ear normal.     Nose: No congestion or rhinorrhea.  Eyes:     Extraocular Movements: Extraocular movements intact.     Right eye: No nystagmus.     Left eye: No nystagmus.     Pupils: Pupils are equal, round, and reactive to light.  Cardiovascular:     Rate and Rhythm: Normal rate and regular rhythm.     Pulses: Normal pulses.     Heart sounds: Normal heart  sounds. No murmur heard. Pulmonary:     Effort: Pulmonary effort is normal. No respiratory distress.     Breath sounds: Normal breath sounds. No wheezing, rhonchi or rales.  Abdominal:     General: Bowel sounds are normal.     Palpations: Abdomen is soft. There is no mass.     Tenderness: no abdominal tenderness There is no guarding.     Hernia: No hernia is present.  Musculoskeletal:        General: Normal range of motion.     Cervical back: Normal range of motion and neck supple.  Skin:    General: Skin is warm and dry.  Neurological:     Mental Status: She is alert and oriented to person, place, and time.     Cranial Nerves: No facial asymmetry.     Motor: Motor function is intact. No weakness.  Psychiatric:        Behavior: Behavior normal.   BP 120/78   Pulse 87   Temp 98.7 F (37.1 C)   Resp 16   Ht 5\' 5"  (1.651 m)   Wt 132 lb 6.4 oz (60.1 kg)   LMP 12/14/2016 Comment: Priior the Cycle on 12/14/2016, Patient states that she hadn't had a cycle in at least six months.  SpO2 98%   BMI 22.03 kg/m  Wt Readings from Last 3 Encounters:  04/22/21 132 lb 6.4 oz (60.1 kg)  02/24/21 134 lb 3.2 oz (60.9 kg)  02/10/21 133 lb (60.3 kg)     Lab Results  Component Value Date   WBC 6.1 04/22/2021    HGB 13.3 04/22/2021   HCT 39.3 04/22/2021   PLT 197.0 04/22/2021   GLUCOSE 75 04/22/2021   CHOL 155 04/22/2021   TRIG 40.0 04/22/2021   HDL 63.80 04/22/2021   LDLCALC 83 04/22/2021   ALT 12 04/22/2021   AST 17 04/22/2021   NA 141 04/22/2021   K 4.1 04/22/2021   CL 104 04/22/2021   CREATININE 0.97 04/22/2021   BUN 20 04/22/2021   CO2 27 04/22/2021   TSH 0.72 04/22/2021    No results found.   Assessment & Plan:  Plan    No orders of the defined types were placed in this encounter.   Problem List Items Addressed This Visit     Preventative health care - Primary    Patient encouraged to maintain heart healthy diet, regular exercise, adequate sleep. Consider daily probiotics. Take medications as prescribed. Labs ordered and reviewed       Relevant Orders   CBC (Completed)   Comprehensive metabolic panel (Completed)   Lipid panel (Completed)   TSH (Completed)   Meniere disease, left    She noted an increase in tinnitus in left ear after COVID vaccination. She saw her ENT and they found a 40 % hearing loss. She follows with Dr Cresenciano Lick       Low back pain    Encouraged moist heat and gentle stretching as tolerated. May try NSAIDs and prescription meds as directed and report if symptoms worsen or seek immediate care. Better since starting to exercise more       Knee pain, bilateral    Worse over months, no acute injury or swelling but increasing throbbing and even buckling when she tries to move, sit. She has been using CBD with some relief. Will proceed with xrays and referral to Sports Medicine.        Relevant Orders   DG Knee Complete 4 Views  Left   DG Knee Complete 4 Views Right   History of 2019 novel coronavirus disease (COVID-19)    She had an infection in January       Dysuria    Check urinalysis and urine culture       Relevant Orders   Urinalysis   Urine Culture (Completed)   Other Visit Diagnoses     Urinary frequency       Relevant  Orders   Urinalysis   Urine Culture (Completed)   Colon cancer screening       Relevant Orders   Ambulatory referral to Gastroenterology       Follow-up: Return in about 6 months (around 10/23/2021), or 6 mn f/u,.  I, Suezanne Jacquet, acting as a scribe for Penni Homans, MD, have documented all relevent documentation on behalf of Penni Homans, MD, as directed by Penni Homans, MD while in the presence of Penni Homans, MD.  I, Mosie Lukes, MD personally performed the services described in this documentation. All medical record entries made by the scribe were at my direction and in my presence. I have reviewed the chart and agree that the record reflects my personal performance and is accurate and complete

## 2021-04-22 NOTE — Assessment & Plan Note (Signed)
Encouraged moist heat and gentle stretching as tolerated. May try NSAIDs and prescription meds as directed and report if symptoms worsen or seek immediate care. Better since starting to exercise more

## 2021-04-22 NOTE — Assessment & Plan Note (Signed)
She noted an increase in tinnitus in left ear after COVID vaccination. She saw her ENT and they found a 40 % hearing loss. She follows with Dr Cresenciano Lick

## 2021-04-22 NOTE — Assessment & Plan Note (Signed)
Worse over months, no acute injury or swelling but increasing throbbing and even buckling when she tries to move, sit. She has been using CBD with some relief. Will proceed with xrays and referral to Sports Medicine.

## 2021-04-22 NOTE — Assessment & Plan Note (Signed)
Patient encouraged to maintain heart healthy diet, regular exercise, adequate sleep. Consider daily probiotics. Take medications as prescribed. Labs ordered and reviewed 

## 2021-04-23 DIAGNOSIS — R300 Dysuria: Secondary | ICD-10-CM | POA: Insufficient documentation

## 2021-04-23 LAB — COMPREHENSIVE METABOLIC PANEL
ALT: 12 U/L (ref 0–35)
AST: 17 U/L (ref 0–37)
Albumin: 4.4 g/dL (ref 3.5–5.2)
Alkaline Phosphatase: 66 U/L (ref 39–117)
BUN: 20 mg/dL (ref 6–23)
CO2: 27 mEq/L (ref 19–32)
Calcium: 9.9 mg/dL (ref 8.4–10.5)
Chloride: 104 mEq/L (ref 96–112)
Creatinine, Ser: 0.97 mg/dL (ref 0.40–1.20)
GFR: 65.1 mL/min (ref >60.00)
Glucose, Bld: 75 mg/dL (ref 70–99)
Potassium: 4.1 mEq/L (ref 3.5–5.1)
Sodium: 141 mEq/L (ref 135–145)
Total Bilirubin: 0.8 mg/dL (ref 0.2–1.2)
Total Protein: 6.7 g/dL (ref 6.0–8.3)

## 2021-04-23 LAB — URINALYSIS, ROUTINE W REFLEX MICROSCOPIC
Bilirubin Urine: NEGATIVE
Leukocytes,Ua: NEGATIVE
Nitrite: NEGATIVE
Specific Gravity, Urine: 1.025 (ref 1.000–1.030)
Total Protein, Urine: NEGATIVE
Urine Glucose: NEGATIVE
Urobilinogen, UA: 0.2 (ref 0.0–1.0)
pH: 6 (ref 5.0–8.0)

## 2021-04-23 LAB — CBC
HCT: 39.3 % (ref 36.0–46.0)
Hemoglobin: 13.3 g/dL (ref 12.0–15.0)
MCHC: 33.8 g/dL (ref 30.0–36.0)
MCV: 96.4 fl (ref 78.0–100.0)
Platelets: 197 10*3/uL (ref 150.0–400.0)
RBC: 4.08 Mil/uL (ref 3.87–5.11)
RDW: 12.8 % (ref 11.5–15.5)
WBC: 6.1 10*3/uL (ref 4.0–10.5)

## 2021-04-23 LAB — LIPID PANEL
Cholesterol: 155 mg/dL (ref 0–200)
HDL: 63.8 mg/dL (ref >39.00)
LDL Cholesterol: 83 mg/dL (ref 0–99)
NonHDL: 91.24
Total CHOL/HDL Ratio: 2
Triglycerides: 40 mg/dL (ref 0.0–149.0)
VLDL: 8 mg/dL (ref 0.0–40.0)

## 2021-04-23 LAB — URINE CULTURE
MICRO NUMBER:: 12081707
SPECIMEN QUALITY:: ADEQUATE

## 2021-04-23 LAB — TSH: TSH: 0.72 u[IU]/mL (ref 0.35–5.50)

## 2021-04-23 NOTE — Assessment & Plan Note (Signed)
Check urinalysis and urine culture 

## 2021-05-13 ENCOUNTER — Encounter: Payer: 59 | Admitting: Family Medicine

## 2021-05-19 ENCOUNTER — Other Ambulatory Visit: Payer: Self-pay | Admitting: Family Medicine

## 2021-05-19 ENCOUNTER — Encounter: Payer: Self-pay | Admitting: Family Medicine

## 2021-05-19 MED ORDER — METHYLPREDNISOLONE 4 MG PO TABS: ORAL_TABLET | ORAL | 0 refills | Status: DC

## 2021-05-20 ENCOUNTER — Other Ambulatory Visit: Payer: Self-pay | Admitting: Family Medicine

## 2021-05-20 DIAGNOSIS — M6281 Muscle weakness (generalized): Secondary | ICD-10-CM

## 2021-09-01 ENCOUNTER — Ambulatory Visit: Payer: 59 | Admitting: Neurology

## 2021-10-13 ENCOUNTER — Emergency Department: Admit: 2021-10-13 | Discharge status: Absent without leave | Payer: Self-pay

## 2021-10-13 ENCOUNTER — Emergency Department
Admission: EM | Admit: 2021-10-13 | Discharge: 2021-10-13 | Disposition: A | Discharge status: Home / Self Care | Payer: 59 | Source: Home / Self Care

## 2021-10-13 ENCOUNTER — Other Ambulatory Visit: Payer: Self-pay

## 2021-10-13 ENCOUNTER — Emergency Department (INDEPENDENT_AMBULATORY_CARE_PROVIDER_SITE_OTHER): Payer: 59

## 2021-10-13 DIAGNOSIS — J309 Allergic rhinitis, unspecified: Secondary | ICD-10-CM | POA: Diagnosis not present

## 2021-10-13 DIAGNOSIS — R053 Chronic cough: Secondary | ICD-10-CM | POA: Diagnosis not present

## 2021-10-13 DIAGNOSIS — R059 Cough, unspecified: Secondary | ICD-10-CM | POA: Diagnosis not present

## 2021-10-13 MED ORDER — CEFDINIR 300 MG PO CAPS: 300.0000 mg | ORAL_CAPSULE | Freq: Two times a day (BID) | ORAL | 0 refills | Status: AC

## 2021-10-13 MED ORDER — PREDNISONE 20 MG PO TABS: ORAL_TABLET | ORAL | 0 refills | Status: DC

## 2021-10-13 MED ORDER — BENZONATATE 200 MG PO CAPS: 200.0000 mg | ORAL_CAPSULE | Freq: Three times a day (TID) | ORAL | 0 refills | Status: AC | PRN

## 2021-10-13 MED ORDER — FEXOFENADINE HCL 180 MG PO TABS: 180.0000 mg | ORAL_TABLET | Freq: Every day | ORAL | 0 refills | Status: DC

## 2021-10-13 NOTE — ED Provider Notes (Signed)
[name]    CSN: [id number] Arrival date & time: 10/13/21  1802      History   Chief Complaint Chief Complaint  Patient presents with   Cough    Cough, loss of taste and smell. X3 weeks    HPI Nicole Lynch is a 55 y.o. female.   HPI 55 year old female presents with cough and loss of taste and smell for 5 weeks since cough started and lost of taste and smell.  Past Medical History:  Diagnosis Date   Abdominal bloating 08/16/2017   Allergy    seasonal   Chicken pox as a child   Depression 05   postpartum    Meniere disease, left 02/03/2016   Migraine    Preventative health care 02/03/2016   Sinusitis acute 06/29/2011   SOM (serous otitis media) 02/03/2016   Tick bite of shoulder 02/09/2017    Patient Active Problem List   Diagnosis Date Noted   Dysuria 04/23/2021   Knee pain, bilateral 04/22/2021   History of 2019 novel coronavirus disease (COVID-19) 04/22/2021   Low back pain 08/16/2017   Preventative health care 02/03/2016   Meniere disease, left 02/03/2016   Palpitations 08/31/2014   Skin cancer 06/29/2011   Allergic state    Chicken pox    Depression    Migraine     Past Surgical History:  Procedure Laterality Date   CESAREAN SECTION  2-05, 10-07   X 2   deviated septum repair  1982    OB History   No obstetric history on file.      Home Medications    Prior to Admission medications   Medication Sig Start Date End Date Taking? Authorizing Provider  albuterol (VENTOLIN HFA) 108 (90 Base) MCG/ACT inhaler Inhale 2 puffs into the lungs every 6 (six) hours as needed for wheezing or shortness of breath. 10/23/20  Yes Mosie Lukes, MD  benzonatate (TESSALON) 200 MG capsule Take 1 capsule (200 mg total) by mouth 3 (three) times daily as needed for up to 7 days for cough. 10/13/21 10/20/21 Yes Eliezer Lofts, FNP  cefdinir (OMNICEF) 300 MG capsule Take 1 capsule (300 mg total) by mouth 2 (two) times daily for 7 days. 10/13/21 10/20/21 Yes  Eliezer Lofts, FNP  fexofenadine [facility] ALLERGY) 180 MG tablet Take 1 tablet (180 mg total) by mouth daily for 15 days. 10/13/21 10/28/21 Yes Eliezer Lofts, FNP  fluticasone (FLONASE) 50 MCG/ACT nasal spray Place 2 sprays into both nostrils daily. 10/21/16  Yes Saguier, Percell Miller, PA-C  fluticasone (FLOVENT HFA) 220 MCG/ACT inhaler Inhale 1 puff into the lungs in the morning and at bedtime. 10/23/20  Yes Mosie Lukes, MD  HYDROcodone-homatropine [facility]) 5-1.5 MG/5ML syrup Take 5 mLs by mouth every 8 (eight) hours as needed for cough. 10/23/20  Yes Mosie Lukes, MD  HYDROCORTISONE ACE, RECTAL, 30 MG SUPP Place 1 suppository (30 mg total) rectally at bedtime as needed. 10/21/16  Yes Saguier, Percell Miller, PA-C  methylPREDNISolone (MEDROL) 4 MG tablet 5 tabs po x 1 day then 4 tabs po x 1 day then 3 tabs po x 1 day then 2 tabs po x 1 day then 1 tab po x 1 day and stop 05/19/21  Yes Mosie Lukes, MD  OMEGA 3 1000 MG CAPS Take 1 capsule by mouth daily. Reported on 02/03/2016   Yes [provider]  ondansetron (ZOFRAN ODT) 4 MG disintegrating tablet Take 1 tablet (4 mg total) by mouth every 8 (eight) hours  as needed for nausea or vomiting. 02/24/21  Yes Jaffe, Adam R, DO  ondansetron (ZOFRAN) 4 MG tablet Take 1 tablet (4 mg total) by mouth every 8 (eight) hours as needed for nausea or vomiting. 11/14/15  Yes Ward, Cyril Mourning N, DO  predniSONE (DELTASONE) 20 MG tablet Take 2 tabs PO daily x 5 days. 10/13/21  Yes Eliezer Lofts, FNP  Rimegepant Sulfate (NURTEC) 75 MG TBDP Take 75 mg by mouth as needed (take 1 tab at the earlist onset of  migraine max 1 tab in 24 hours). 03/18/21  Yes Jaffe, Adam R, DO  SUMAtriptan (IMITREX) 100 MG tablet Take 1 tablet (100 mg total) by mouth daily as needed for migraine. May repeat dose in 2 hours if no relief.  Do not exceed 2 doses in 24 hours. 08/23/20  Yes Mosie Lukes, MD  triamterene-hydrochlorothiazide (DYAZIDE) 37.5-25 MG capsule Take 1 capsule by mouth daily as needed.  Reported on 02/03/2016 12/26/15  Yes [provider]    Family History Family History  Problem Relation Age of Onset   Vision loss Mother        macular degeneration   Heart disease Father        afib on blood thinner   Cancer Father        prostate cancer    Social History Social History   Tobacco Use   Smoking status: Never   Smokeless tobacco: Never  Vaping Use   Vaping Use: Never used  Substance Use Topics   Alcohol use: No    Alcohol/week: 0.0 standard drinks    Comment:     Drug use: No     Allergies   Latex   Review of Systems Review of Systems  Respiratory:  Positive for cough.   All other systems reviewed and are negative.   Physical Exam Triage Vital Signs ED Triage Vitals  Enc Vitals Group     BP 10/13/21 1849 131/88     Pulse Rate 10/13/21 1849 80     Resp 10/13/21 1849 20     Temp 10/13/21 1849 98.9 F (37.2 C)     Temp src --      SpO2 10/13/21 1849 100 %     Weight 10/13/21 1846 128 lb (58.1 kg)     Height 10/13/21 1846 5\' 4"  (1.626 m)     Head Circumference --      Peak Flow --      Pain Score 10/13/21 1846 0     Pain Loc --      Pain Edu? --      Excl. in [place]? --    No data found.  Updated Vital Signs BP 131/88 (BP Location: Left Arm)    Pulse 80    Temp 98.9 F (37.2 C)    Resp 20    Ht 5\' 4"  (1.626 m)    Wt 128 lb (58.1 kg)    LMP 12/14/2016 Comment: Priior the Cycle on 12/14/2016, Patient states that she hadn't had a cycle in at least six months.   SpO2 100%    BMI 21.97 kg/m   Physical Exam Vitals and nursing note reviewed.  Constitutional:      Appearance: Normal appearance. She is normal weight.  HENT:     Head: Normocephalic and atraumatic.     Right Ear: Tympanic membrane, ear canal and external ear normal.     Left Ear: Tympanic membrane, ear canal and external ear normal.  Mouth/Throat:     Mouth: Mucous membranes are moist.     Pharynx: Oropharynx is clear.     Comments: Moderate amount of clear drainage  of posterior oropharynx noted Eyes:     Extraocular Movements: Extraocular movements intact.     Conjunctiva/sclera: Conjunctivae normal.     Pupils: Pupils are equal, round, and reactive to light.  Cardiovascular:     Rate and Rhythm: Normal rate and regular rhythm.     Pulses: Normal pulses.     Heart sounds: Normal heart sounds.  Pulmonary:     Effort: Pulmonary effort is normal.     Breath sounds: No stridor. No wheezing, rhonchi or rales.     Comments: Diminished breath sounds noted throughout, frequent nonproductive cough noted on exam Musculoskeletal:     Cervical back: Normal range of motion and neck supple.  Skin:    General: Skin is warm and dry.  Neurological:     General: No focal deficit present.     Mental Status: She is alert and oriented to person, place, and time.     UC Treatments / Results  Labs (all labs ordered are listed, but only abnormal results are displayed) Labs Reviewed - No data to display  EKG   Radiology DG Chest 2 View  Result Date: 10/13/2021 CLINICAL DATA:  Cough for several weeks EXAM: CHEST - 2 VIEW COMPARISON:  08/28/2012 FINDINGS: The heart size and mediastinal contours are within normal limits. Both lungs are clear. The visualized skeletal structures are unremarkable. IMPRESSION: No active cardiopulmonary disease. Electronically Signed   By: Inez Catalina M.D.   On: 10/13/2021 20:08    Procedures Procedures (including critical care time)  Medications Ordered in UC Medications - No data to display  Initial Impression / Assessment and Plan / UC Course  I have reviewed the triage vital signs and the nursing notes.  Pertinent labs & imaging results that were available during my care of the patient were reviewed by me and considered in my medical decision making (see chart for details).     MDM: 1. Cough-CXR revealed no acute cardiopulmonary process (above) Rx'd Cefdinir, Tessalon Perles, and Prednisone; 2.  Allergic rhinitis-Rx'd  Allegra. Advised patient to take medication as directed with food to completion.  Advised patient to take Prednisone and Allegra with first dose of Cefdinir for the next 5 of 7 days.  Advised may use use Allegra as needed afterwards for concurrent postnasal drip/drainage.  Advised may use Tessalon Perles daily or as needed for cough.  Encouraged patient increase daily water intake while taking these medications.  Patient discharged home, hemodynamically stable. Final Clinical Impressions(s) / UC Diagnoses   Final diagnoses:  Cough, unspecified type  Allergic rhinitis, unspecified seasonality, unspecified trigger     Discharge Instructions      Advised patient to take medication as directed with food to completion.  Advised patient to take Prednisone and Allegra with first dose of Cefdinir for the next 5 of 7 days.  Advised may use use Allegra as needed afterwards for concurrent postnasal drip/drainage.  Advised may use Tessalon Perles daily or as needed for cough.  Encouraged patient increase daily water intake while taking these medications.     ED Prescriptions     Medication Sig Dispense Auth. Provider   cefdinir (OMNICEF) 300 MG capsule Take 1 capsule (300 mg total) by mouth 2 (two) times daily for 7 days. 14 capsule Eliezer Lofts, FNP   predniSONE (DELTASONE) 20 MG tablet Take  2 tabs PO daily x 5 days. 10 tablet Eliezer Lofts, FNP   benzonatate (TESSALON) 200 MG capsule Take 1 capsule (200 mg total) by mouth 3 (three) times daily as needed for up to 7 days for cough. 40 capsule Eliezer Lofts, FNP   fexofenadine [facility] ALLERGY) 180 MG tablet Take 1 tablet (180 mg total) by mouth daily for 15 days. 15 tablet Eliezer Lofts, FNP      PDMP not reviewed this encounter.   Eliezer Lofts, [place] 10/14/21 [phone]

## 2021-10-13 NOTE — Discharge Instructions (Addendum)
Advised patient to take medication as directed with food to completion.  Advised patient to take Prednisone and Allegra with first dose of Cefdinir for the next 5 of 7 days.  Advised may use use Allegra as needed afterwards for concurrent postnasal drip/drainage.  Advised may use Tessalon Perles daily or as needed for cough.  Encouraged patient increase daily water intake while taking these medications.

## 2021-10-13 NOTE — ED Triage Notes (Signed)
Pt states that she has a cough and loss of taste and smell. X3 weeks  Pt states that she is vaccinated. Pt states that she hasn't had flu vaccine.

## 2021-10-23 ENCOUNTER — Ambulatory Visit: Payer: 59 | Admitting: Family Medicine

## 2021-10-23 ENCOUNTER — Ambulatory Visit (INDEPENDENT_AMBULATORY_CARE_PROVIDER_SITE_OTHER): Payer: 59 | Admitting: Family Medicine

## 2021-10-23 VITALS — BP 143/93 | HR 73 | Temp 98.4°F | Resp 12 | Ht 65.0 in | Wt 133.4 lb

## 2021-10-23 DIAGNOSIS — R42 Dizziness and giddiness: Secondary | ICD-10-CM

## 2021-10-23 DIAGNOSIS — Z1211 Encounter for screening for malignant neoplasm of colon: Secondary | ICD-10-CM

## 2021-10-23 DIAGNOSIS — R059 Cough, unspecified: Secondary | ICD-10-CM

## 2021-10-23 DIAGNOSIS — H8102 Meniere's disease, left ear: Secondary | ICD-10-CM | POA: Diagnosis not present

## 2021-10-23 DIAGNOSIS — G43709 Chronic migraine without aura, not intractable, without status migrainosus: Secondary | ICD-10-CM | POA: Diagnosis not present

## 2021-10-23 DIAGNOSIS — R140 Abdominal distension (gaseous): Secondary | ICD-10-CM | POA: Diagnosis not present

## 2021-10-23 DIAGNOSIS — R030 Elevated blood-pressure reading, without diagnosis of hypertension: Secondary | ICD-10-CM | POA: Insufficient documentation

## 2021-10-23 NOTE — Progress Notes (Signed)
Subjective:    Patient ID: Nicole Lynch, female    DOB: 08/22/1966, 56 y.o.   MRN: [id number]  Chief Complaint  Patient presents with   Follow-up    HPI Patient is in today for follow up and more acute concerns. She has been struggling with a cough which has been flared since before Thanksgiving.  She has been on a couple rounds of antibiotics and is most recently finishing cefdinir.  Is still on prednisone.  She has also been under a great deal of stress as her 39 year old father [place] has recently been hospitalized and sent home and is in need of more constant care.  She continues to try and juggle her teenage children her job and now her ailing parents.  As a result she has not been caring for herself ideally and acknowledges some dehydration has flared some episodes of vertigo.  When she bends over or moves quickly, rolls over in bed she will have a spinning sensation which does fade fairly quickly but recurs with further movement.  No nausea vomiting.  No flare in headaches.  No flare in tendinitis or hearing loss.  No fevers no chills.  She does have a sense of mild congestion but most notably postnasal drip and a nagging cough.  It bothers her day and night is generally not productive but is often a tickle that will not dissipate.  No complaints of chest pain or GI concerns. Denies CP/palp/SOB/HA/fevers/GI or GU c/o. Taking meds as prescribed   Past Medical History:  Diagnosis Date   Abdominal bloating 08/16/2017   Allergy    seasonal   Chicken pox as a child   Depression 3   postpartum    Meniere disease, left 02/03/2016   Migraine    Preventative health care 02/03/2016   Sinusitis acute 06/29/2011   SOM (serous otitis media) 02/03/2016   Tick bite of shoulder 02/09/2017    Past Surgical History:  Procedure Laterality Date   CESAREAN SECTION  2-05, 10-07   X 2   deviated septum repair  1982    Family History  Problem Relation Age  of Onset   Vision loss Mother        macular degeneration   Heart disease Father        afib on blood thinner   Cancer Father        prostate cancer    Social History   Socioeconomic History   Marital status: Married    Spouse name: Not on file   Number of children: 2   Years of education: Not on file   Highest education level: Not on file  Occupational History   Occupation: Conservation officer, nature   Tobacco Use   Smoking status: Never   Smokeless tobacco: Never  Vaping Use   Vaping Use: Never used  Substance and Sexual Activity   Alcohol use: No    Alcohol/week: 0.0 standard drinks    Comment:     Drug use: No   Sexual activity: Not on file  Other Topics Concern   Not on file  Social History Narrative   Right handed   Social Determinants of Health   Financial Resource Strain: Not on file  Food Insecurity: Not on file  Transportation Needs: Not on file  Physical Activity: Not on file  Stress: Not on file  Social Connections: Not on file  Intimate Partner Violence: Not on file  Outpatient Medications Prior to Visit  Medication Sig Dispense Refill   albuterol (VENTOLIN HFA) 108 (90 Base) MCG/ACT inhaler Inhale 2 puffs into the lungs every 6 (six) hours as needed for wheezing or shortness of breath. 1 each 0   benzonatate (TESSALON) 200 MG capsule Take 200 mg by mouth 3 (three) times daily as needed for cough.     fexofenadine (ALLEGRA ALLERGY) 180 MG tablet Take 1 tablet (180 mg total) by mouth daily for 15 days. 15 tablet 0   fluticasone (FLONASE) 50 MCG/ACT nasal spray Place 2 sprays into both nostrils daily. 16 g 1   fluticasone (FLOVENT HFA) 220 MCG/ACT inhaler Inhale 1 puff into the lungs in the morning and at bedtime. 1 each 1   HYDROcodone-homatropine (HYCODAN) 5-1.5 MG/5ML syrup Take 5 mLs by mouth every 8 (eight) hours as needed for cough. 120 mL 0   HYDROCORTISONE ACE, RECTAL, 30 MG SUPP Place 1 suppository (30 mg total) rectally at bedtime as needed. 28 each 1    OMEGA 3 1000 MG CAPS Take 1 capsule by mouth daily. Reported on 02/03/2016     ondansetron (ZOFRAN ODT) 4 MG disintegrating tablet Take 1 tablet (4 mg total) by mouth every 8 (eight) hours as needed for nausea or vomiting. 20 tablet 5   ondansetron (ZOFRAN) 4 MG tablet Take 1 tablet (4 mg total) by mouth every 8 (eight) hours as needed for nausea or vomiting. 20 tablet 0   OVER THE COUNTER MEDICATION Hypothalamus PMG     predniSONE (DELTASONE) 10 MG tablet Take 40 mg by mouth daily.     Rimegepant Sulfate (NURTEC) 75 MG TBDP Take 75 mg by mouth as needed (take 1 tab at the earlist onset of  migraine max 1 tab in 24 hours). 8 tablet 5   SUMAtriptan (IMITREX) 100 MG tablet Take 1 tablet (100 mg total) by mouth daily as needed for migraine. May repeat dose in 2 hours if no relief.  Do not exceed 2 doses in 24 hours. 10 tablet 2   triamterene-hydrochlorothiazide (DYAZIDE) 37.5-25 MG capsule Take 1 capsule by mouth daily as needed. Reported on 02/03/2016  1   methylPREDNISolone (MEDROL) 4 MG tablet 5 tabs po x 1 day then 4 tabs po x 1 day then 3 tabs po x 1 day then 2 tabs po x 1 day then 1 tab po x 1 day and stop 15 tablet 0   predniSONE (DELTASONE) 20 MG tablet Take 2 tabs PO daily x 5 days. 10 tablet 0   No facility-administered medications prior to visit.    Allergies  Allergen Reactions   Latex Itching    Review of Systems  Constitutional:  Positive for malaise/fatigue. Negative for fever.  HENT:  Positive for congestion, hearing loss and tinnitus. Negative for ear discharge, ear pain, sinus pain and sore throat.   Eyes:  Negative for blurred vision.  Respiratory:  Positive for cough. Negative for shortness of breath.   Cardiovascular:  Negative for chest pain, palpitations and leg swelling.  Gastrointestinal:  Negative for abdominal pain, blood in stool and nausea.  Genitourinary:  Negative for dysuria and frequency.  Musculoskeletal:  Negative for falls.  Skin:  Negative for rash.   Neurological:  Positive for dizziness. Negative for loss of consciousness and headaches.  Endo/Heme/Allergies:  Negative for environmental allergies.  Psychiatric/Behavioral:  Negative for depression. The patient is not nervous/anxious.       Objective:    Physical Exam Constitutional:  General: She is not in acute distress.    Appearance: She is well-developed.  HENT:     Head: Normocephalic and atraumatic.  Eyes:     Conjunctiva/sclera: Conjunctivae normal.  Neck:     Thyroid: No thyromegaly.  Cardiovascular:     Rate and Rhythm: Normal rate and regular rhythm.     Heart sounds: Normal heart sounds. No murmur heard. Pulmonary:     Effort: Pulmonary effort is normal. No respiratory distress.     Breath sounds: Normal breath sounds.  Abdominal:     General: Bowel sounds are normal. There is no distension.     Palpations: Abdomen is soft. There is no mass.     Tenderness: There is no abdominal tenderness.  Musculoskeletal:     Cervical back: Neck supple.  Lymphadenopathy:     Cervical: No cervical adenopathy.  Skin:    General: Skin is warm and dry.  Neurological:     Mental Status: She is alert and oriented to person, place, and time.  Psychiatric:        Behavior: Behavior normal.    BP (!) 143/93 (BP Location: Right Arm, Cuff Size: Normal)    Pulse 73    Temp 98.4 F (36.9 C) (Oral)    Resp 12    Ht 5\' 5"  (1.651 m)    Wt 133 lb 6.4 oz (60.5 kg)    LMP 12/14/2016 Comment: Priior the Cycle on 12/14/2016, Patient states that she hadn't had a cycle in at least six months.   SpO2 97%    BMI 22.20 kg/m  Wt Readings from Last 3 Encounters:  10/23/21 133 lb 6.4 oz (60.5 kg)  10/13/21 128 lb (58.1 kg)  04/22/21 132 lb 6.4 oz (60.1 kg)    Diabetic Foot Exam - Simple   No data filed    Lab Results  Component Value Date   WBC 6.1 04/22/2021   HGB 13.3 04/22/2021   HCT 39.3 04/22/2021   PLT 197.0 04/22/2021   GLUCOSE 75 04/22/2021   CHOL 155 04/22/2021   TRIG  40.0 04/22/2021   HDL 63.80 04/22/2021   LDLCALC 83 04/22/2021   ALT 12 04/22/2021   AST 17 04/22/2021   NA 141 04/22/2021   K 4.1 04/22/2021   CL 104 04/22/2021   CREATININE 0.97 04/22/2021   BUN 20 04/22/2021   CO2 27 04/22/2021   TSH 0.72 04/22/2021    Lab Results  Component Value Date   TSH 0.72 04/22/2021   Lab Results  Component Value Date   WBC 6.1 04/22/2021   HGB 13.3 04/22/2021   HCT 39.3 04/22/2021   MCV 96.4 04/22/2021   PLT 197.0 04/22/2021   Lab Results  Component Value Date   NA 141 04/22/2021   K 4.1 04/22/2021   CO2 27 04/22/2021   GLUCOSE 75 04/22/2021   BUN 20 04/22/2021   CREATININE 0.97 04/22/2021   BILITOT 0.8 04/22/2021   ALKPHOS 66 04/22/2021   AST 17 04/22/2021   ALT 12 04/22/2021   PROT 6.7 04/22/2021   ALBUMIN 4.4 04/22/2021   CALCIUM 9.9 04/22/2021   ANIONGAP <5 (L) 02/11/2021   GFR 65.10 04/22/2021   Lab Results  Component Value Date   CHOL 155 04/22/2021   Lab Results  Component Value Date   HDL 63.80 04/22/2021   Lab Results  Component Value Date   LDLCALC 83 04/22/2021   Lab Results  Component Value Date   TRIG 40.0 04/22/2021   Lab  Results  Component Value Date   CHOLHDL 2 04/22/2021   No results found for: HGBA1C     Assessment & Plan:   Problem List Items Addressed This Visit     Migraine    Encouraged increased hydration, 64 ounces of clear fluids daily. Minimize alcohol and caffeine. Eat small frequent meals with lean proteins and complex carbs. Avoid high and low blood sugars. Get adequate sleep, 7-8 hours a night. Needs exercise daily preferably in the morning. Better with decreasing gluten      Meniere disease, left    Follows with Dr Cresenciano Lick every 6 months at [facility]. 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 Has had episodes many years ago. Is referred back to ENT for evaluation      Relevant Orders   Ambulatory referral to ENT   Transient elevated blood pressure    Improved slightly with recheck and likely up due to illness and prednisone. Patient asked to check it once to twice weekly at rest and report numbers. Encouraged heart healthy diet such as the DASH diet and exercise as tolerated. Minimize sodium, alcohol and caffeine      Other Visit Diagnoses     Cough in adult    -  Primary   Relevant Orders   Ambulatory referral to ENT   Colon cancer screening       Relevant Orders   Ambulatory referral to Gastroenterology       I have discontinued Dee Paden. Emley's methylPREDNISolone. I am also having her maintain her Omega 3, ondansetron, triamterene-hydrochlorothiazide, HYDROCORTISONE ACE (RECTAL), fluticasone, SUMAtriptan, Flovent HFA, albuterol, HYDROcodone-homatropine, ondansetron, Nurtec, fexofenadine, predniSONE, benzonatate, and OVER THE COUNTER MEDICATION.  No orders of the defined types were placed in this encounter.    Penni Homans, MD

## 2021-10-23 NOTE — Assessment & Plan Note (Signed)
Improved slightly with recheck and likely up due to illness and prednisone. Patient asked to check it once to twice weekly at rest and report numbers. Encouraged heart healthy diet such as the DASH diet and exercise as tolerated. Minimize sodium, alcohol and caffeine

## 2021-10-23 NOTE — Assessment & Plan Note (Signed)
Follows with Dr Cresenciano Lick every 6 months at [facility]. 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

## 2021-10-23 NOTE — Patient Instructions (Addendum)
Flonase daily, nasal saline 3 x daily. Zyxal 5 mg twice a day and Mucinex plain twice daily both for about 2 weeks. Hydrate 70-80 ounces of fluid daily. Silent hearburn can contribute watch acid and spice in evening, Famotidine/Pepcid 20-40 mg at bedtime if no improvement  Shingrix is the new shingles shot, 2 shots over 2-6 months, confirm coverage with insurance and document, then can return here for shots with nurse appt or at pharmacy   Cough, Adult Coughing is a reflex that clears your throat and your airways (respiratory system). Coughing helps to heal and protect your lungs. It is normal to cough occasionally, but a cough that happens with other symptoms or lasts a long time may be a sign of a condition that needs treatment. An acute cough may only last 2-3 weeks, while a chronic cough may last 8 or more weeks. Coughing is commonly caused by: Infection of the respiratory systemby viruses or bacteria. Breathing in substances that irritate your lungs. Allergies. Asthma. Mucus that runs down the back of your throat (postnasal drip). Smoking. Acid backing up from the stomach into the esophagus (gastroesophageal reflux). Certain medicines. Chronic lung problems. Other medical conditions such as heart failure or a blood clot in the lung (pulmonary embolism). Follow these instructions at home: Medicines Take over-the-counter and prescription medicines only as told by your health care provider. Talk with your health care provider before you take a cough suppressant medicine. Lifestyle  Avoid cigarette smoke. Do not use any products that contain nicotine or tobacco, such as cigarettes, e-cigarettes, and chewing tobacco. If you need help quitting, ask your health care provider. Drink enough fluid to keep your urine pale yellow. Avoid caffeine. Do not drink alcohol if your health care provider tells you not to drink. General instructions  Pay close attention to changes in your cough. Tell  your health care provider about them. Always cover your mouth when you cough. Avoid things that make you cough, such as perfume, candles, cleaning products, or campfire or tobacco smoke. If the air is dry, use a cool mist vaporizer or humidifier in your bedroom or your home to help loosen secretions. If your cough is worse at night, try to sleep in a semi-upright position. Rest as needed. Keep all follow-up visits as told by your health care provider. This is important. Contact a health care provider if you: Have new symptoms. Cough up pus. Have a cough that does not get better after 2-3 weeks or gets worse. Cannot control your cough with cough suppressant medicines and you are losing sleep. Have pain that gets worse or pain that is not helped with medicine. Have a fever. Have unexplained weight loss. Have night sweats. Get help right away if: You cough up blood. You have difficulty breathing. Your heartbeat is very fast. These symptoms may represent a serious problem that is an emergency. Do not wait to see if the symptoms will go away. Get medical help right away. Call your local emergency services (911 in the [place] Do not drive yourself to the hospital. Summary Coughing is a reflex that clears your throat and your airways. It is normal to cough occasionally, but a cough that happens with other symptoms or lasts a long time may be a sign of a condition that needs treatment. Take over-the-counter and prescription medicines only as told by your health care provider. Always cover your mouth when you cough. Contact a health care provider if you have new symptoms or a cough that does  not get better after 2-3 weeks or gets worse. This information is not intended to replace advice given to you by your health care provider. Make sure you discuss any questions you have with your health care provider. Document Revised: 10/24/2018 Document Reviewed: 10/24/2018 Elsevier Patient Education  [place].  Vertigo Vertigo is the feeling that you or your surroundings are moving when they are not. This feeling can come and go at any time. Vertigo often goes away on its own. Vertigo can be dangerous if it occurs while you are doing something that could endanger yourself or others, such as driving or operating machinery. Your health care provider will do tests to try to determine the cause of your vertigo. Tests will also help your health care provider decide how best to treat your condition. Follow these instructions at home: Eating and drinking   Dehydration can make vertigo worse. Drink enough fluid to keep your urine pale yellow. Do not drink alcohol. Activity Return to your normal activities as told by your health care provider. Ask your health care provider what activities are safe for you. In the morning, first sit up on the side of the bed. When you feel okay, stand slowly while you hold onto something until you know that your balance is fine. Move slowly. Avoid sudden body or head movements or certain positions, as told by your health care provider. If you have trouble walking or keeping your balance, try using a cane for stability. If you feel dizzy or unstable, sit down right away. Avoid doing any tasks that would cause danger to you or others if vertigo occurs. Avoid bending down if you feel dizzy. Place items in your home so that they are easy for you to reach without bending or leaning over. Do not drive or use machinery if you feel dizzy. General instructions Take over-the-counter and prescription medicines only as told by your health care provider. Keep all follow-up visits. This is important. Contact a health care provider if: Your medicines do not relieve your vertigo or they make it worse. Your condition gets worse or you develop new symptoms. You have a fever. You develop nausea or vomiting, or if nausea gets worse. Your family or friends notice any behavioral  changes. You have numbness or a prickling and tingling sensation in part of your body. Get help right away if you: Are always dizzy or you faint. Develop severe headaches. Develop a stiff neck. Develop sensitivity to light. Have difficulty moving or speaking. Have weakness in your hands, arms, or legs. Have changes in your hearing or vision. These symptoms may represent a serious problem that is an emergency. Do not wait to see if the symptoms will go away. Get medical help right away. Call your local emergency services (911 in the [place] Do not drive yourself to the hospital. Summary Vertigo is the feeling that you or your surroundings are moving when they are not. Your health care provider will do tests to try to determine the cause of your vertigo. Follow instructions for home care. You may be told to avoid certain tasks, positions, or movements. Contact a health care provider if your medicines do not relieve your symptoms, or if you have a fever, nausea, vomiting, or changes in behavior. Get help right away if you have severe headaches or difficulty speaking, or you develop hearing or vision problems. This information is not intended to replace advice given to you by your health care provider. Make sure you  discuss any questions you have with your health care provider. Document Revised: 09/04/2020 Document Reviewed: 09/04/2020 Elsevier Patient Education  2022 [employer].

## 2021-10-23 NOTE — Assessment & Plan Note (Addendum)
Hydrate, rest, consider PT report if persists. Has had episodes many years ago. Is referred back to ENT for evaluation

## 2021-10-23 NOTE — Assessment & Plan Note (Signed)
Better since cutting down on gluten

## 2021-10-23 NOTE — Assessment & Plan Note (Signed)
Encouraged increased hydration, 64 ounces of clear fluids daily. Minimize alcohol and caffeine. Eat small frequent meals with lean proteins and complex carbs. Avoid high and low blood sugars. Get adequate sleep, 7-8 hours a night. Needs exercise daily preferably in the morning. Better with decreasing gluten

## 2021-10-26 ENCOUNTER — Encounter: Payer: Self-pay | Admitting: Family Medicine

## 2021-10-27 NOTE — Telephone Encounter (Signed)
Spoke with pt and suggested she do an e visit because she stated that she was experiencing vertigo and unable to drive.

## 2021-12-19 ENCOUNTER — Encounter: Payer: Self-pay | Admitting: Family Medicine

## 2021-12-22 ENCOUNTER — Telehealth: Payer: Self-pay | Admitting: Family Medicine

## 2021-12-22 DIAGNOSIS — Z1211 Encounter for screening for malignant neoplasm of colon: Secondary | ICD-10-CM

## 2021-12-22 NOTE — Telephone Encounter (Signed)
Pt called for update on GI referral ? ?Advised pt referral closed due to multiple attempts trying to contact pt. Pt did not receive phone calls & would like referral reopened  ? ?Please advise  ?

## 2021-12-22 NOTE — Telephone Encounter (Signed)
Please see note attached!  ? ?Please advise!  ?

## 2021-12-23 NOTE — Telephone Encounter (Signed)
New GI referral ordered.  ?

## 2021-12-23 NOTE — Telephone Encounter (Signed)
Dr. Nani Ravens has never seen this patient. ?She may need an appointment with her PCP. ?

## 2021-12-23 NOTE — Telephone Encounter (Signed)
See note attached!  ? ?Please advise!  ?

## 2022-01-25 ENCOUNTER — Other Ambulatory Visit: Payer: Self-pay | Admitting: Neurology

## 2022-01-29 ENCOUNTER — Telehealth: Payer: Self-pay | Admitting: Neurology

## 2022-01-29 ENCOUNTER — Other Ambulatory Visit: Payer: Self-pay | Admitting: Neurology

## 2022-01-29 DIAGNOSIS — G43019 Migraine without aura, intractable, without status migrainosus: Secondary | ICD-10-CM

## 2022-01-29 NOTE — Telephone Encounter (Signed)
Patient called and stated she needs a refill for Nurtec.  She only has 1 pill left.  She uses [facility] on [address]. ?

## 2022-01-30 ENCOUNTER — Ambulatory Visit (AMBULATORY_SURGERY_CENTER): Payer: 59

## 2022-01-30 VITALS — Ht 65.0 in | Wt 132.4 lb

## 2022-01-30 DIAGNOSIS — Z1211 Encounter for screening for malignant neoplasm of colon: Secondary | ICD-10-CM

## 2022-01-30 MED ORDER — PLENVU 140 G PO SOLR: 1.0000 | Freq: Once | ORAL | 0 refills | Status: AC

## 2022-01-30 NOTE — Progress Notes (Addendum)
No egg or soy allergy known to patient  ?No issues known to pt with past sedation with any surgeries or procedures ?Patient denies ever being told they had issues or difficulty with intubation  ?No FH of Malignant Hyperthermia ?Pt is not on diet pills ?Pt is not on  home 02  ?Pt is not on blood thinners  ?Pt denies issues with constipation  ?No A fib or A flutter ? ?Plenvu Coupon to pt in PV today , Code to Pharmacy and  NO PA's for preps discussed with pt In PV today  ?Discussed with pt there will be an out-of-pocket cost for prep and that varies from $0 to 70 +  dollars - pt verbalized understanding  ?Pt instructed to use Singlecare.com or GoodRx for a price reduction on prep  ? ?PV completed in person. ? ? .  ?Pt encouraged to call with questions or issues.  ?If pt has My chart, procedure instructions sent via My Chart  ? ?

## 2022-02-13 ENCOUNTER — Encounter: Payer: 59 | Admitting: Gastroenterology

## 2022-02-18 ENCOUNTER — Encounter: Payer: Self-pay | Admitting: Gastroenterology

## 2022-02-26 ENCOUNTER — Encounter: Payer: Self-pay | Admitting: Gastroenterology

## 2022-02-26 ENCOUNTER — Ambulatory Visit (AMBULATORY_SURGERY_CENTER): Payer: 59 | Admitting: Gastroenterology

## 2022-02-26 VITALS — BP 114/86 | HR 69 | Temp 97.7°F | Resp 14 | Ht 65.0 in | Wt 132.0 lb

## 2022-02-26 DIAGNOSIS — Z1211 Encounter for screening for malignant neoplasm of colon: Secondary | ICD-10-CM

## 2022-02-26 MED ORDER — SODIUM CHLORIDE 0.9 % IV SOLN: 500.0000 mL | Freq: Once | INTRAVENOUS | Status: DC

## 2022-02-26 NOTE — Op Note (Signed)
[facility] ?Patient Name: Nicole Lynch ?Procedure Date: 02/26/2022 8:41 AM ?MRN: [id number] ?Endoscopist: Jackquline Denmark , MD ?Age: 56 ?Referring MD:  ?Date of Birth: Mar 18, 1966 ?Gender: Female ?Account #: [id number] ?Procedure:                Colonoscopy ?Indications:              Screening for colorectal malignant neoplasm ?Medicines:                Monitored Anesthesia Care ?Procedure:                Pre-Anesthesia Assessment: ?                          - Prior to the procedure, a History and Physical  ?                          was performed, and patient medications and  ?                          allergies were reviewed. The patient's tolerance of  ?                          previous anesthesia was also reviewed. The risks  ?                          and benefits of the procedure and the sedation  ?                          options and risks were discussed with the patient.  ?                          All questions were answered, and informed consent  ?                          was obtained. Prior Anticoagulants: The patient has  ?                          taken no previous anticoagulant or antiplatelet  ?                          agents. ASA Grade Assessment: I - A normal, healthy  ?                          patient. After reviewing the risks and benefits,  ?                          the patient was deemed in satisfactory condition to  ?                          undergo the procedure. ?                          After obtaining informed consent, the colonoscope  ?  was passed under direct vision. Throughout the  ?                          procedure, the patient's blood pressure, pulse, and  ?                          oxygen saturations were monitored continuously. The  ?                          Olympus PCF-H190DL (#[id number]) Colonoscope was  ?                          introduced through the anus and advanced to the the  ?                          cecum, identified by appendiceal  orifice and  ?                          ileocecal valve. The colonoscopy was somewhat  ?                          difficult due to a redundant colon. Successful  ?                          completion of the procedure was aided by applying  ?                          abdominal pressure. The patient tolerated the  ?                          procedure well. The quality of the bowel  ?                          preparation was good. The ileocecal valve,  ?                          appendiceal orifice, and rectum were photographed. ?Scope In: 9:01:47 AM ?Scope Out: 9:18:29 AM ?Scope Withdrawal Time: 0 hours 8 minutes 12 seconds  ?Total Procedure Duration: 0 hours 16 minutes 42 seconds  ?Findings:                 A few rare small-mouthed diverticula were found in  ?                          the sigmoid colon and ascending colon. ?                          Non-bleeding internal hemorrhoids were found during  ?                          retroflexion. The hemorrhoids were moderate and  ?                          Grade I (internal hemorrhoids that do not prolapse). ?  The exam was otherwise without abnormality on  ?                          direct and retroflexion views. ?Complications:            No immediate complications. ?Estimated Blood Loss:     Estimated blood loss: none. ?Impression:               - Very minimal colonic diverticulosis. ?                          - Non-bleeding internal hemorrhoids. ?                          - The examination was otherwise normal on direct  ?                          and retroflexion views. ?                          - No specimens collected. ?Recommendation:           - Patient has a contact number available for  ?                          emergencies. The signs and symptoms of potential  ?                          delayed complications were discussed with the  ?                          patient. Return to normal activities tomorrow.  ?                           Written discharge instructions were provided to the  ?                          patient. ?                          - Resume previous diet. ?                          - Continue present medications. ?                          - Repeat colonoscopy in 10 years for screening  ?                          purposes. Earlier, if with any new problems or  ?                          change in family history. ?                          - The findings and recommendations were discussed  ?  with the patient's family. ?Jackquline Denmark, MD ?02/26/2022 9:21:58 AM ?This report has been signed electronically. ?

## 2022-02-26 NOTE — Progress Notes (Signed)
Pt's states no medical or surgical changes since previsit or office visit. 

## 2022-02-26 NOTE — Progress Notes (Signed)
VSS, transported to PACU °

## 2022-02-26 NOTE — Patient Instructions (Signed)
Handout given for diverticulosis. ? ?YOU HAD AN ENDOSCOPIC PROCEDURE TODAY AT [facility]:   Refer to the procedure report that was given to you for any specific questions about what was found during the examination.  If the procedure report does not answer your questions, please call your gastroenterologist to clarify.  If you requested that your care partner not be given the details of your procedure findings, then the procedure report has been included in a sealed envelope for you to review at your convenience later. ? ?YOU SHOULD EXPECT: Some feelings of bloating in the abdomen. Passage of more gas than usual.  Walking can help get rid of the air that was put into your GI tract during the procedure and reduce the bloating. If you had a lower endoscopy (such as a colonoscopy or flexible sigmoidoscopy) you may notice spotting of blood in your stool or on the toilet paper. If you underwent a bowel prep for your procedure, you may not have a normal bowel movement for a few days. ? ?Please Note:  You might notice some irritation and congestion in your nose or some drainage.  This is from the oxygen used during your procedure.  There is no need for concern and it should clear up in a day or so. ? ?SYMPTOMS TO REPORT IMMEDIATELY: ? ?Following lower endoscopy (colonoscopy or flexible sigmoidoscopy): ? Excessive amounts of blood in the stool ? Significant tenderness or worsening of abdominal pains ? Swelling of the abdomen that is new, acute ? Fever of 100?F or higher ? ?For urgent or emergent issues, a gastroenterologist can be reached at any hour by calling [phone]. ?Do not use MyChart messaging for urgent concerns.  ? ? ?DIET:  We do recommend a small meal at first, but then you may proceed to your regular diet.  Drink plenty of fluids but you should avoid alcoholic beverages for 24 hours. ? ?ACTIVITY:  You should plan to take it easy for the rest of today and you should NOT DRIVE or use heavy  machinery until tomorrow (because of the sedation medicines used during the test).   ? ?FOLLOW UP: ?Our staff will call the number listed on your records 48-72 hours following your procedure to check on you and address any questions or concerns that you may have regarding the information given to you following your procedure. If we do not reach you, we will leave a message.  We will attempt to reach you two times.  During this call, we will ask if you have developed any symptoms of COVID 19. If you develop any symptoms (ie: fever, flu-like symptoms, shortness of breath, cough etc.) before then, please call [phone].  If you test positive for Covid 19 in the 2 weeks post procedure, please call and report this information to [place].   ? ?There were no polyps seen today!  You will need another screening colonoscopy in 10 years, you will receive a letter at that time when you are due for the procedure.   Please call us at [phone] if you have a change in bowel habits, change in family history of colo-rectal cancer, rectal bleeding or other GI concern before that time.  ? ?SIGNATURES/CONFIDENTIALITY: ?You and/or your care partner have signed paperwork which will be entered into your electronic medical record.  These signatures attest to the fact that that the information above on your After Visit Summary has been reviewed and is understood.  Full responsibility of the confidentiality  of this discharge information lies with you and/or your care-partner.  ?

## 2022-02-26 NOTE — Progress Notes (Signed)
[facility] History and Physical ? ? ?Primary Care Physician:  Mosie Lukes, MD ? ? ?Reason for Procedure:  Colon cancer screening ? ?Plan:    colon ? ? ? ? ?HPI: Nicole Lynch is a 56 y.o. female  ? ?No nausea, vomiting, heartburn, regurgitation, odynophagia or dysphagia.  No significant diarrhea or constipation.  No melena or hematochezia. No unintentional weight loss. No abdominal pain. ?Past Medical History:  ?Diagnosis Date  ? Abdominal bloating 08/16/2017  ? Allergy   ? seasonal, gluten  ? Chicken pox as a child  ? Depression 2005  ? postpartum   ? Meniere disease, left 02/03/2016  ? Migraine   ? Preventative health care 02/03/2016  ? Sinusitis acute 06/29/2011  ? SOM (serous otitis media) 02/03/2016  ? Tick bite of shoulder 02/09/2017  ? ? ?Past Surgical History:  ?Procedure Laterality Date  ? CESAREAN SECTION  2-05, 10-07  ? X 2  ? deviated septum repair  1982  ? ? ?Prior to Admission medications   ?Medication Sig Start Date End Date Taking? Authorizing Provider  ?acetaminophen (TYLENOL) 325 MG tablet Tylenol 325 mg tablet ? Take 2 tablets by oral route.   Yes [provider]  ?NURTEC 75 MG TBDP TAKE 1 TABLET BY MOUTH AT THE EARLIEST ONSET OF MIGRAINE. MAX 1 TAB/24 HOURS. 01/29/22  Yes Tat, Eustace Quail, DO  ?ondansetron (ZOFRAN ODT) 4 MG disintegrating tablet Take 1 tablet (4 mg total) by mouth every 8 (eight) hours as needed for nausea or vomiting. 02/24/21  Yes Pieter Partridge, DO  ?OVER THE COUNTER MEDICATION Hypothalamus PMG   Yes [provider]  ?triamterene-hydrochlorothiazide (DYAZIDE) 37.5-25 MG capsule Take 1 capsule by mouth daily as needed. Reported on 02/03/2016 12/26/15  Yes [provider]  ?albuterol (VENTOLIN HFA) 108 (90 Base) MCG/ACT inhaler Inhale 2 puffs into the lungs every 6 (six) hours as needed for wheezing or shortness of breath. 10/23/20   Mosie Lukes, MD  ?benzonatate (TESSALON) 200 MG capsule Take 200 mg by mouth 3 (three) times daily as needed  for cough.    [provider]  ?fexofenadine (ALLEGRA ALLERGY) 180 MG tablet Take 1 tablet (180 mg total) by mouth daily for 15 days. 10/13/21 10/28/21  Eliezer Lofts, FNP  ?fluticasone (FLONASE) 50 MCG/ACT nasal spray Place 2 sprays into both nostrils daily. 10/21/16   Saguier, Percell Miller, PA-C  ?fluticasone (FLOVENT HFA) 220 MCG/ACT inhaler Inhale 1 puff into the lungs in the morning and at bedtime. ?Patient taking differently: Inhale 1 puff into the lungs in the morning and at bedtime. As needed with illness 10/23/20   Mosie Lukes, MD  ?HYDROcodone-homatropine [facility]) 5-1.5 MG/5ML syrup Take 5 mLs by mouth every 8 (eight) hours as needed for cough. 10/23/20   Mosie Lukes, MD  ?HYDROCORTISONE ACE, RECTAL, 30 MG SUPP Place 1 suppository (30 mg total) rectally at bedtime as needed. 10/21/16   Saguier, Percell Miller, PA-C  ?OMEGA 3 1000 MG CAPS Take 1 capsule by mouth daily. Reported on 02/03/2016 ?Patient not taking: Reported on 02/26/2022    [provider]  ?ondansetron (ZOFRAN) 4 MG tablet Take 1 tablet (4 mg total) by mouth every 8 (eight) hours as needed for nausea or vomiting. 11/14/15   Ward, Delice Bison, DO  ?predniSONE (DELTASONE) 10 MG tablet Take 40 mg by mouth daily. 10/13/21   [provider]  ?SUMAtriptan (IMITREX) 100 MG tablet Take 1 tablet (100 mg total) by mouth daily as needed for migraine.  May repeat dose in 2 hours if no relief.  Do not exceed 2 doses in 24 hours. ?Patient not taking: Reported on 02/26/2022 08/23/20   Mosie Lukes, MD  ? ? ?Current Outpatient Medications  ?Medication Sig Dispense Refill  ? acetaminophen (TYLENOL) 325 MG tablet Tylenol 325 mg tablet ? Take 2 tablets by oral route.    ? NURTEC 75 MG TBDP TAKE 1 TABLET BY MOUTH AT THE EARLIEST ONSET OF MIGRAINE. MAX 1 TAB/24 HOURS. 8 tablet 5  ? ondansetron (ZOFRAN ODT) 4 MG disintegrating tablet Take 1 tablet (4 mg total) by mouth every 8 (eight) hours as needed for nausea or vomiting. 20 tablet 5  ? OVER THE COUNTER  MEDICATION Hypothalamus PMG    ? triamterene-hydrochlorothiazide (DYAZIDE) 37.5-25 MG capsule Take 1 capsule by mouth daily as needed. Reported on 02/03/2016  1  ? albuterol (VENTOLIN HFA) 108 (90 Base) MCG/ACT inhaler Inhale 2 puffs into the lungs every 6 (six) hours as needed for wheezing or shortness of breath. 1 each 0  ? benzonatate (TESSALON) 200 MG capsule Take 200 mg by mouth 3 (three) times daily as needed for cough.    ? fexofenadine (ALLEGRA ALLERGY) 180 MG tablet Take 1 tablet (180 mg total) by mouth daily for 15 days. 15 tablet 0  ? fluticasone (FLONASE) 50 MCG/ACT nasal spray Place 2 sprays into both nostrils daily. 16 g 1  ? fluticasone (FLOVENT HFA) 220 MCG/ACT inhaler Inhale 1 puff into the lungs in the morning and at bedtime. (Patient taking differently: Inhale 1 puff into the lungs in the morning and at bedtime. As needed with illness) 1 each 1  ? HYDROcodone-homatropine (HYCODAN) 5-1.5 MG/5ML syrup Take 5 mLs by mouth every 8 (eight) hours as needed for cough. 120 mL 0  ? HYDROCORTISONE ACE, RECTAL, 30 MG SUPP Place 1 suppository (30 mg total) rectally at bedtime as needed. 28 each 1  ? OMEGA 3 1000 MG CAPS Take 1 capsule by mouth daily. Reported on 02/03/2016 (Patient not taking: Reported on 02/26/2022)    ? ondansetron (ZOFRAN) 4 MG tablet Take 1 tablet (4 mg total) by mouth every 8 (eight) hours as needed for nausea or vomiting. 20 tablet 0  ? predniSONE (DELTASONE) 10 MG tablet Take 40 mg by mouth daily.    ? SUMAtriptan (IMITREX) 100 MG tablet Take 1 tablet (100 mg total) by mouth daily as needed for migraine. May repeat dose in 2 hours if no relief.  Do not exceed 2 doses in 24 hours. (Patient not taking: Reported on 02/26/2022) 10 tablet 2  ? ?Current Facility-Administered Medications  ?Medication Dose Route Frequency Provider Last Rate Last Admin  ? 0.9 %  sodium chloride infusion  500 mL Intravenous Once Jackquline Denmark, MD      ? ? ?Allergies as of 02/26/2022 - Review Complete 02/26/2022   ?Allergen Reaction Noted  ? Other Other (See Comments) 01/22/2022  ? Latex Itching 10/21/2016  ? ? ?Family History  ?Problem Relation Age of Onset  ? Vision loss Mother   ?     macular degeneration  ? Heart disease Father   ?     afib on blood thinner  ? Cancer Father   ?     prostate cancer  ? Colon cancer Neg Hx   ? Colon polyps Neg Hx   ? Esophageal cancer Neg Hx   ? Rectal cancer Neg Hx   ? Stomach cancer Neg Hx   ? ? ?Social History  ? ?  Socioeconomic History  ? Marital status: Married  ?  Spouse name: Not on file  ? Number of children: 2  ? Years of education: Not on file  ? Highest education level: Not on file  ?Occupational History  ? Occupation: Conservation officer, nature   ?Tobacco Use  ? Smoking status: Never  ?  Passive exposure: Past  ? Smokeless tobacco: Never  ?Vaping Use  ? Vaping Use: Never used  ?Substance and Sexual Activity  ? Alcohol use: No  ?  Alcohol/week: 0.0 standard drinks  ?  Comment:    ? Drug use: Never  ? Sexual activity: Yes  ?  Birth control/protection: Post-menopausal  ?Other Topics Concern  ? Not on file  ?Social History Narrative  ? Right handed  ? ?Social Determinants of Health  ? ?Financial Resource Strain: Not on file  ?Food Insecurity: Not on file  ?Transportation Needs: Not on file  ?Physical Activity: Not on file  ?Stress: Not on file  ?Social Connections: Not on file  ?Intimate Partner Violence: Not on file  ? ? ?Review of Systems: ?Positive for none ?All other review of systems negative except as mentioned in the HPI. ? ?Physical Exam: ?Vital signs in last 24 hours: ?'@VSRANGES'$ @ ?  ?General:   Alert,  Well-developed, well-nourished, pleasant and cooperative in NAD ?Lungs:  Clear throughout to auscultation.   ?Heart:  Regular rate and rhythm; no murmurs, clicks, rubs,  or gallops. ?Abdomen:  Soft, nontender and nondistended. Normal bowel sounds.   ?Neuro/Psych:  Alert and cooperative. Normal mood and affect. A and O x 3 ? ? ? ?No significant changes were identified.  The patient continues  to be an appropriate candidate for the planned procedure and anesthesia. ? ? ?Carmell Austria, MD. ?[facility] ?02/26/2022 8:58 AM@ ? ?

## 2022-03-02 ENCOUNTER — Telehealth: Payer: Self-pay

## 2022-03-02 NOTE — Telephone Encounter (Signed)
?  Follow up Call- ? ? ?  02/26/2022  ?  8:31 AM  ?Call back number  ?Post procedure Call Back phone  # [phone]  ?Permission to leave phone message Yes  ?  ? ?Patient questions: ? ?Do you have a fever, pain , or abdominal swelling? No. ?Pain Score  0 * ? ?Have you tolerated food without any problems? Yes.   ? ?Have you been able to return to your normal activities? Yes.   ? ?Do you have any questions about your discharge instructions: ?Diet   No. ?Medications  No. ?Follow up visit  No. ? ?Do you have questions or concerns about your Care? No. ? ?Actions: ?* If pain score is 4 or above: ?No action needed, pain <4. ? ? ?Per Pt she reports she has not had a BM yet.  Pt was told this is not uncommon.  It can take several days to get back to regular BM.  Pt requested a time frame when she should have BM.  I told pt to call us if no BM within 7 days. ? ? ?

## 2022-03-04 LAB — HM COLONOSCOPY

## 2022-03-04 LAB — HM MAMMOGRAPHY

## 2022-03-23 ENCOUNTER — Telehealth (HOSPITAL_COMMUNITY): Payer: Self-pay | Admitting: Pharmacy Technician

## 2022-03-23 ENCOUNTER — Other Ambulatory Visit (HOSPITAL_COMMUNITY): Payer: Self-pay

## 2022-03-23 NOTE — Telephone Encounter (Signed)
Patient Advocate Encounter   Received notification that prior authorization for Nurtec '75MG'$  dispersible tablets is required.   PA submitted on 03/23/2022 [place] Status is pending       Lyndel Safe, [place] Patient Advocate Specialist [place] Patient Advocate Team Direct Number: [phone]  Fax: [phone]

## 2022-03-24 NOTE — Telephone Encounter (Signed)
Patient Advocate Encounter  Prior Authorization for Nurtec '75MG'$  dispersible tablets has been approved.    PA# [id number] Effective dates: 03/23/2022 through 03/23/2023      Lyndel Safe, [place] Patient Advocate Specialist [place] Patient Advocate Team Direct Number: [phone]  Fax: [phone]

## 2022-03-30 ENCOUNTER — Ambulatory Visit: Payer: 59 | Admitting: Neurology

## 2022-04-22 NOTE — Progress Notes (Signed)
Subjective:    Patient ID: Nicole Lynch, female    DOB: [birth year], 56 y.o.   MRN: [id number]  Chief Complaint  Patient presents with   Annual Exam    HPI Patient is in today for an annual physical exam and she is doing well. No recent febrile illness or acute hospitalizations. Her son is about to start at [employer] and her family is doing well. She is maintaining a heart healthy diet and is staying active. Her biggest concern is her right leg pain, starts in posterior hip and pain can extend down the leg to the knee. No fall or trauma. Pain is manageable but persistent. Denies CP/palp/SOB/HA/congestion/fevers/GI or GU c/o. 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   Migraine    Preventative health care 02/03/2016   Sinusitis acute 06/29/2011   SOM (serous otitis media) 02/03/2016   Tick bite of shoulder 02/09/2017    Past Surgical History:  Procedure Laterality Date   CESAREAN SECTION  2-05, 10-07   X 2   deviated septum repair  1982    Family History  Problem Relation Age of Onset   Vision loss Mother        macular degeneration   Heart disease Father        afib on blood thinner   Cancer Father        prostate cancer   Colon cancer Neg Hx    Colon polyps Neg Hx    Esophageal cancer Neg Hx    Rectal cancer Neg Hx    Stomach cancer Neg Hx     Social History   Socioeconomic History   Marital status: Married    Spouse name: Not on file   Number of children: 2   Years of education: Not on file   Highest education level: Not on file  Occupational History   Occupation: Conservation officer, nature   Tobacco Use   Smoking status: Never    Passive exposure: Past   Smokeless tobacco: Never  Vaping Use   Vaping Use: Never used  Substance and Sexual Activity   Alcohol use: No    Alcohol/week:  0.0 standard drinks of alcohol    Comment:     Drug use: Never   Sexual activity: Yes    Birth control/protection: Post-menopausal  Other Topics Concern   Not on file  Social History Narrative   Right handed   Social Determinants of Health   Financial Resource Strain: Not on file  Food Insecurity: Not on file  Transportation Needs: Not on file  Physical Activity: Not on file  Stress: Not on file  Social Connections: Not on file  Intimate Partner Violence: Not on file    Outpatient Medications Prior to Visit  Medication Sig Dispense Refill   acetaminophen (TYLENOL) 325 MG tablet Tylenol 325 mg tablet  Take 2 tablets by oral route.     albuterol (VENTOLIN HFA) 108 (90 Base) MCG/ACT inhaler Inhale 2 puffs into the lungs every 6 (six) hours as needed for wheezing or shortness of breath. 1 each 0   benzonatate (TESSALON) 200 MG capsule Take 200 mg by mouth 3 (three) times daily as needed for cough.     fexofenadine (ALLEGRA ALLERGY) 180 MG tablet Take 1 tablet (180 mg  total) by mouth daily for 15 days. 15 tablet 0   fluticasone (FLONASE) 50 MCG/ACT nasal spray Place 2 sprays into both nostrils daily. 16 g 1   fluticasone (FLOVENT HFA) 220 MCG/ACT inhaler Inhale 1 puff into the lungs in the morning and at bedtime. (Patient taking differently: Inhale 1 puff into the lungs in the morning and at bedtime. As needed with illness) 1 each 1   HYDROcodone-homatropine (HYCODAN) 5-1.5 MG/5ML syrup Take 5 mLs by mouth every 8 (eight) hours as needed for cough. 120 mL 0   HYDROCORTISONE ACE, RECTAL, 30 MG SUPP Place 1 suppository (30 mg total) rectally at bedtime as needed. 28 each 1   NURTEC 75 MG TBDP TAKE 1 TABLET BY MOUTH AT THE EARLIEST ONSET OF MIGRAINE. MAX 1 TAB/24 HOURS. 8 tablet 5   OMEGA 3 1000 MG CAPS Take 1 capsule by mouth daily. Reported on 02/03/2016 (Patient not taking: Reported on 02/26/2022)     ondansetron (ZOFRAN ODT) 4 MG disintegrating tablet Take 1 tablet (4 mg total) by mouth  every 8 (eight) hours as needed for nausea or vomiting. 20 tablet 5   ondansetron (ZOFRAN) 4 MG tablet Take 1 tablet (4 mg total) by mouth every 8 (eight) hours as needed for nausea or vomiting. 20 tablet 0   OVER THE COUNTER MEDICATION Hypothalamus PMG     predniSONE (DELTASONE) 10 MG tablet Take 40 mg by mouth daily.     SUMAtriptan (IMITREX) 100 MG tablet Take 1 tablet (100 mg total) by mouth daily as needed for migraine. May repeat dose in 2 hours if no relief.  Do not exceed 2 doses in 24 hours. (Patient not taking: Reported on 02/26/2022) 10 tablet 2   triamterene-hydrochlorothiazide (DYAZIDE) 37.5-25 MG capsule Take 1 capsule by mouth daily as needed. Reported on 02/03/2016  1   No facility-administered medications prior to visit.    Allergies  Allergen Reactions   Other Other (See Comments)   Latex Itching    Bandaids    Review of Systems  Constitutional:  Negative for chills, fever and malaise/fatigue.  HENT:  Negative for congestion and hearing loss.   Eyes:  Negative for discharge.  Respiratory:  Negative for cough, sputum production and shortness of breath.   Cardiovascular:  Negative for chest pain, palpitations and leg swelling.  Gastrointestinal:  Negative for abdominal pain, blood in stool, constipation, diarrhea, heartburn, nausea and vomiting.  Genitourinary:  Negative for dysuria, frequency, hematuria and urgency.  Musculoskeletal:  Positive for joint pain. Negative for back pain, falls and myalgias.  Skin:  Negative for rash.  Neurological:  Negative for dizziness, sensory change, loss of consciousness, weakness and headaches.  Endo/Heme/Allergies:  Negative for environmental allergies. Does not bruise/bleed easily.  Psychiatric/Behavioral:  Negative for depression and suicidal ideas. The patient is not nervous/anxious and does not have insomnia.        Objective:    Physical Exam Constitutional:      General: She is not in acute distress.    Appearance: She is  well-developed.  HENT:     Head: Normocephalic and atraumatic.  Eyes:     Conjunctiva/sclera: Conjunctivae normal.  Neck:     Thyroid: No thyromegaly.  Cardiovascular:     Rate and Rhythm: Normal rate and regular rhythm.     Heart sounds: Normal heart sounds. No murmur heard. Pulmonary:     Effort: Pulmonary effort is normal. No respiratory distress.     Breath sounds: Normal breath sounds.  Abdominal:  General: Bowel sounds are normal. There is no distension.     Palpations: Abdomen is soft. There is no mass.     Tenderness: There is no abdominal tenderness.  Musculoskeletal:     Cervical back: Neck supple.  Lymphadenopathy:     Cervical: No cervical adenopathy.  Skin:    General: Skin is warm and dry.  Neurological:     Mental Status: She is alert and oriented to person, place, and time.  Psychiatric:        Behavior: Behavior normal.     BP 130/88 (BP Location: Right Arm, Patient Position: Sitting, Cuff Size: Normal)   Pulse 71   Resp 20   Ht '5\' 5"'$  (1.651 m)   Wt 130 lb 12.8 oz (59.3 kg)   LMP 12/14/2016 Comment: Priior the Cycle on 12/14/2016, Patient states that she hadn't had a cycle in at least six months.  SpO2 98%   BMI 21.77 kg/m  Wt Readings from Last 3 Encounters:  04/23/22 130 lb 12.8 oz (59.3 kg)  02/26/22 132 lb (59.9 kg)  01/30/22 132 lb 6.4 oz (60.1 kg)    Diabetic Foot Exam - Simple   No data filed    Lab Results  Component Value Date   WBC 6.3 04/23/2022   HGB 13.5 04/23/2022   HCT 39.9 04/23/2022   PLT 207.0 04/23/2022   GLUCOSE 78 04/23/2022   CHOL 161 04/23/2022   TRIG 75.0 04/23/2022   HDL 61.90 04/23/2022   LDLCALC 84 04/23/2022   ALT 14 04/23/2022   AST 17 04/23/2022   NA 137 04/23/2022   K 4.1 04/23/2022   CL 103 04/23/2022   CREATININE 0.96 04/23/2022   BUN 24 (H) 04/23/2022   CO2 25 04/23/2022   TSH 1.04 04/23/2022    Lab Results  Component Value Date   TSH 1.04 04/23/2022   Lab Results  Component Value Date    WBC 6.3 04/23/2022   HGB 13.5 04/23/2022   HCT 39.9 04/23/2022   MCV 96.7 04/23/2022   PLT 207.0 04/23/2022   Lab Results  Component Value Date   NA 137 04/23/2022   K 4.1 04/23/2022   CO2 25 04/23/2022   GLUCOSE 78 04/23/2022   BUN 24 (H) 04/23/2022   CREATININE 0.96 04/23/2022   BILITOT 0.7 04/23/2022   ALKPHOS 69 04/23/2022   AST 17 04/23/2022   ALT 14 04/23/2022   PROT 6.6 04/23/2022   ALBUMIN 4.4 04/23/2022   CALCIUM 9.7 04/23/2022   ANIONGAP <5 (L) 02/11/2021   GFR 65.45 04/23/2022   Lab Results  Component Value Date   CHOL 161 04/23/2022   Lab Results  Component Value Date   HDL 61.90 04/23/2022   Lab Results  Component Value Date   LDLCALC 84 04/23/2022   Lab Results  Component Value Date   TRIG 75.0 04/23/2022   Lab Results  Component Value Date   CHOLHDL 3 04/23/2022   No results found for: "HGBA1C"     Assessment & Plan:   COLONOSCOPY: 03/04/17 (CG), Cologuard ordered MAMMO: 07/25/2019 PAP: 07/25/2019 PSA: n/a DEXA: n/a   Problem List Items Addressed This Visit     Migraine    Improved after Gluten, Encouraged increased hydration, 64 ounces of clear fluids daily. Minimize alcohol and caffeine. Eat small frequent meals with lean proteins and complex carbs. Avoid high and low blood sugars. Get adequate sleep, 7-8 hours a night. Needs exercise daily preferably in the morning.      Preventative  health care - Primary    Patient encouraged to maintain heart healthy diet, regular exercise, adequate sleep. Consider daily probiotics. Take medications as prescribed. Labs reviewed. Colonoscopy 02/2022 repeat in 10 years. Sees OB/GYN for pap and MGM      Relevant Orders   TSH (Completed)   Lipid panel (Completed)   Comprehensive metabolic panel (Completed)   CBC (Completed)   Low back pain    With right lower extremity pain in hip and leg to knee and below. Encouraged moist heat and gentle stretching as tolerated. May try NSAIDs and prescription meds  as directed and report if symptoms worsen or seek immediate care      Transient elevated blood pressure   Relevant Orders   Comprehensive metabolic panel (Completed)   CBC (Completed)   Right hip pain    Notes popping and pain in hip, it is worse after prolonged sitting. Will refer to sports medicine for further evaluation. Consider restarting Turmeric       I am having Percell Belt maintain her Omega 3, ondansetron, triamterene-hydrochlorothiazide, HYDROCORTISONE ACE (RECTAL), fluticasone, SUMAtriptan, Flovent HFA, albuterol, HYDROcodone-homatropine, ondansetron, fexofenadine, predniSONE, benzonatate, OVER THE COUNTER MEDICATION, Nurtec, and acetaminophen.  No orders of the defined types were placed in this encounter.

## 2022-04-23 ENCOUNTER — Encounter: Payer: Self-pay | Admitting: Family Medicine

## 2022-04-23 ENCOUNTER — Ambulatory Visit: Payer: 59 | Admitting: Family Medicine

## 2022-04-23 ENCOUNTER — Ambulatory Visit (INDEPENDENT_AMBULATORY_CARE_PROVIDER_SITE_OTHER): Payer: 59 | Admitting: Family Medicine

## 2022-04-23 VITALS — BP 130/88 | HR 71 | Resp 20 | Ht 65.0 in | Wt 130.8 lb

## 2022-04-23 DIAGNOSIS — M545 Low back pain, unspecified: Secondary | ICD-10-CM | POA: Diagnosis not present

## 2022-04-23 DIAGNOSIS — Z0000 Encounter for general adult medical examination without abnormal findings: Secondary | ICD-10-CM | POA: Diagnosis not present

## 2022-04-23 DIAGNOSIS — G43709 Chronic migraine without aura, not intractable, without status migrainosus: Secondary | ICD-10-CM

## 2022-04-23 DIAGNOSIS — M25551 Pain in right hip: Secondary | ICD-10-CM

## 2022-04-23 DIAGNOSIS — R030 Elevated blood-pressure reading, without diagnosis of hypertension: Secondary | ICD-10-CM

## 2022-04-23 DIAGNOSIS — G8929 Other chronic pain: Secondary | ICD-10-CM

## 2022-04-23 NOTE — Assessment & Plan Note (Signed)
Patient encouraged to maintain heart healthy diet, regular exercise, adequate sleep. Consider daily probiotics. Take medications as prescribed. Labs reviewed. Colonoscopy 02/2022 repeat in 10 years. Sees OB/GYN for pap and MGM

## 2022-04-23 NOTE — Assessment & Plan Note (Signed)
Improved after Gluten, Encouraged increased hydration, 64 ounces of clear fluids daily. Minimize alcohol and caffeine. Eat small frequent meals with lean proteins and complex carbs. Avoid high and low blood sugars. Get adequate sleep, 7-8 hours a night. Needs exercise daily preferably in the morning.

## 2022-04-23 NOTE — Assessment & Plan Note (Addendum)
Notes popping and pain in hip, it is worse after prolonged sitting. Will refer to sports medicine for further evaluation. Consider restarting Turmeric

## 2022-04-23 NOTE — Assessment & Plan Note (Addendum)
With right lower extremity pain in hip and leg to knee and below. Encouraged moist heat and gentle stretching as tolerated. May try NSAIDs and prescription meds as directed and report if symptoms worsen or seek immediate care

## 2022-04-23 NOTE — Patient Instructions (Addendum)
Yerba Matte Tea Do Not Drink   CBD Daily extra strength, menthol  Preventive Care 69-58 Years Old, Female Preventive care refers to lifestyle choices and visits with your health care provider that can promote health and wellness. Preventive care visits are also called wellness exams. What can I expect for my preventive care visit? Counseling Your health care provider may ask you questions about your: Medical history, including: Past medical problems. Family medical history. Pregnancy history. Current health, including: Menstrual cycle. Method of birth control. Emotional well-being. Home life and relationship well-being. Sexual activity and sexual health. Lifestyle, including: Alcohol, nicotine or tobacco, and drug use. Access to firearms. Diet, exercise, and sleep habits. Work and work Statistician. Sunscreen use. Safety issues such as seatbelt and bike helmet use. Physical exam Your health care provider will check your: Height and weight. These may be used to calculate your BMI (body mass index). BMI is a measurement that tells if you are at a healthy weight. Waist circumference. This measures the distance around your waistline. This measurement also tells if you are at a healthy weight and may help predict your risk of certain diseases, such as type 2 diabetes and high blood pressure. Heart rate and blood pressure. Body temperature. Skin for abnormal spots. What immunizations do I need?  Vaccines are usually given at various ages, according to a schedule. Your health care provider will recommend vaccines for you based on your age, medical history, and lifestyle or other factors, such as travel or where you work. What tests do I need? Screening Your health care provider may recommend screening tests for certain conditions. This may include: Lipid and cholesterol levels. Diabetes screening. This is done by checking your blood sugar (glucose) after you have not eaten for a while  (fasting). Pelvic exam and Pap test. Hepatitis B test. Hepatitis C test. HIV (human immunodeficiency virus) test. STI (sexually transmitted infection) testing, if you are at risk. Lung cancer screening. Colorectal cancer screening. Mammogram. Talk with your health care provider about when you should start having regular mammograms. This may depend on whether you have a family history of breast cancer. BRCA-related cancer screening. This may be done if you have a family history of breast, ovarian, tubal, or peritoneal cancers. Bone density scan. This is done to screen for osteoporosis. Talk with your health care provider about your test results, treatment options, and if necessary, the need for more tests. Follow these instructions at home: Eating and drinking  Eat a diet that includes fresh fruits and vegetables, whole grains, lean protein, and low-fat dairy products. Take vitamin and mineral supplements as recommended by your health care provider. Do not drink alcohol if: Your health care provider tells you not to drink. You are pregnant, may be pregnant, or are planning to become pregnant. If you drink alcohol: Limit how much you have to 0-1 drink a day. Know how much alcohol is in your drink. In the [place] one drink equals one 12 oz bottle of beer (355 mL), one 5 oz glass of wine (148 mL), or one 1 oz glass of hard liquor (44 mL). Lifestyle Brush your teeth every morning and night with fluoride toothpaste. Floss one time each day. Exercise for at least 30 minutes 5 or more days each week. Do not use any products that contain nicotine or tobacco. These products include cigarettes, chewing tobacco, and vaping devices, such as e-cigarettes. If you need help quitting, ask your health care provider. Do not use drugs. If you are  sexually active, practice safe sex. Use a condom or other form of protection to prevent STIs. If you do not wish to become pregnant, use a form of birth control. If  you plan to become pregnant, see your health care provider for a prepregnancy visit. Take aspirin only as told by your health care provider. Make sure that you understand how much to take and what form to take. Work with your health care provider to find out whether it is safe and beneficial for you to take aspirin daily. Find healthy ways to manage stress, such as: Meditation, yoga, or listening to music. Journaling. Talking to a trusted person. Spending time with friends and family. Minimize exposure to UV radiation to reduce your risk of skin cancer. Safety Always wear your seat belt while driving or riding in a vehicle. Do not drive: If you have been drinking alcohol. Do not ride with someone who has been drinking. When you are tired or distracted. While texting. If you have been using any mind-altering substances or drugs. Wear a helmet and other protective equipment during sports activities. If you have firearms in your house, make sure you follow all gun safety procedures. Seek help if you have been physically or sexually abused. What's next? Visit your health care provider once a year for an annual wellness visit. Ask your health care provider how often you should have your eyes and teeth checked. Stay up to date on all vaccines. This information is not intended to replace advice given to you by your health care provider. Make sure you discuss any questions you have with your health care provider. Document Revised: 04/02/2021 Document Reviewed: 04/02/2021 Elsevier Patient Education  [place].

## 2022-04-24 LAB — LIPID PANEL
Cholesterol: 161 mg/dL (ref 0–200)
HDL: 61.9 mg/dL (ref >39.00)
LDL Cholesterol: 84 mg/dL (ref 0–99)
NonHDL: 98.62
Total CHOL/HDL Ratio: 3
Triglycerides: 75 mg/dL (ref 0.0–149.0)
VLDL: 15 mg/dL (ref 0.0–40.0)

## 2022-04-24 LAB — COMPREHENSIVE METABOLIC PANEL
ALT: 14 U/L (ref 0–35)
AST: 17 U/L (ref 0–37)
Albumin: 4.4 g/dL (ref 3.5–5.2)
Alkaline Phosphatase: 69 U/L (ref 39–117)
BUN: 24 mg/dL — ABNORMAL HIGH (ref 6–23)
CO2: 25 mEq/L (ref 19–32)
Calcium: 9.7 mg/dL (ref 8.4–10.5)
Chloride: 103 mEq/L (ref 96–112)
Creatinine, Ser: 0.96 mg/dL (ref 0.40–1.20)
GFR: 65.45 mL/min (ref >60.00)
Glucose, Bld: 78 mg/dL (ref 70–99)
Potassium: 4.1 mEq/L (ref 3.5–5.1)
Sodium: 137 mEq/L (ref 135–145)
Total Bilirubin: 0.7 mg/dL (ref 0.2–1.2)
Total Protein: 6.6 g/dL (ref 6.0–8.3)

## 2022-04-24 LAB — CBC
HCT: 39.9 % (ref 36.0–46.0)
Hemoglobin: 13.5 g/dL (ref 12.0–15.0)
MCHC: 33.7 g/dL (ref 30.0–36.0)
MCV: 96.7 fl (ref 78.0–100.0)
Platelets: 207 10*3/uL (ref 150.0–400.0)
RBC: 4.12 Mil/uL (ref 3.87–5.11)
RDW: 13.2 % (ref 11.5–15.5)
WBC: 6.3 10*3/uL (ref 4.0–10.5)

## 2022-04-24 LAB — TSH: TSH: 1.04 u[IU]/mL (ref 0.35–5.50)

## 2023-01-31 ENCOUNTER — Other Ambulatory Visit: Payer: Self-pay | Admitting: Neurology

## 2023-01-31 DIAGNOSIS — G43019 Migraine without aura, intractable, without status migrainosus: Secondary | ICD-10-CM

## 2023-02-23 ENCOUNTER — Telehealth: Payer: Self-pay | Admitting: Family Medicine

## 2023-02-23 NOTE — Telephone Encounter (Signed)
Patient called and could not pick up Nurtec from pharmacy, please call patient.

## 2023-02-25 NOTE — Telephone Encounter (Signed)
Called pt lvm needing  her call back regarding medication  Nurtec.

## 2023-02-25 NOTE — Telephone Encounter (Signed)
Called pt lvm and sent her mychart  To let us know about referral.

## 2023-04-28 NOTE — Assessment & Plan Note (Signed)
Patient encouraged to maintain heart healthy diet, regular exercise, adequate sleep. Consider daily probiotics. Take medications as prescribed. Labs reviewed. Colonoscopy 02/2022 repeat in 10 years. Sees OB/GYN for pap will request and MGM 2023 repeat next year will request. Labs ordered and reviewed. Given and reviewed copy of ACP documents from [employer] and encouraged to complete and return

## 2023-04-28 NOTE — Assessment & Plan Note (Deleted)
Improved after Gluten was stopped Encouraged increased hydration, 64 ounces of clear fluids daily. Minimize alcohol and caffeine. Eat small frequent meals with lean proteins and complex carbs. Avoid high and low blood sugars. Get adequate sleep, 7-8 hours a night. Needs exercise daily preferably in the morning.

## 2023-04-28 NOTE — Progress Notes (Signed)
Subjective:    Patient ID: Nicole Lynch, female    DOB: 1966/09/11, 57 y.o.   MRN: [id number]  Chief Complaint  Patient presents with   Annual Exam    Annual Exam    HPI Discussed the use of AI scribe software for clinical note transcription with the patient, who gave verbal consent to proceed.  History of Present Illness   The patient, with a history of tick bites and recent positive alpha-gal test, presents with fatigue, headaches, and dietary changes due to alpha-gal allergy. She reports feeling tired and not rejuvenated by food, regardless of what she eats. She has made dietary changes, eliminating red meat products, but reports that eating does not feel good and does not seem to fuel her body. She also reports having headaches, which have been bothering her again despite not consuming gluten.  In addition to these physical symptoms, the patient is also dealing with mental health issues. She reports feeling depressed and not caring about things, which she attributes to recent family bereavements and associated stress. She has had three panic attacks since her father passed away. She also reports feeling more tired than usual, which she attributes to the stress of caring for her mother who has had multiple falls and injuries.  The patient also mentions having a problem with tick bites, which do not heal easily anymore. She recently had an alpha-gal test, which came back positive. However, she is unsure about the accuracy of this result and would like to recheck it. She also requests a Lyme disease test.    Patient is a 57 yo female in today for annual preventative exam and follow up on chronic medical concerns. No recent febrile illness or hospitalizations. Denies CP/palp/SOB/HA/congestion/fevers/GI or GU c/o. 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   Migraine    Preventative health care 02/03/2016   Sinusitis acute 06/29/2011   SOM (serous otitis media) 02/03/2016   Tick bite of shoulder 02/09/2017    Past Surgical History:  Procedure Laterality Date   CESAREAN SECTION  2-05, 10-07   X 2   deviated septum repair  1982    Family History  Problem Relation Age of Onset   Vision loss Mother        macular degeneration   Heart disease Father        afib on blood thinner   Cancer Father        prostate cancer   Urinary tract infection Father    Colon cancer Brother    Colon polyps Neg Hx    Esophageal cancer Neg Hx    Rectal cancer Neg Hx    Stomach cancer Neg Hx     Social History   Socioeconomic History   Marital status: Married    Spouse name: Not on file   Number of children: 2   Years of education: Not on file   Highest education level: Not on file  Occupational History   Occupation: Building control surveyor   Tobacco Use   Smoking status: Never    Passive exposure: Past   Smokeless tobacco: Never  Vaping Use   Vaping status: Never Used  Substance and Sexual Activity   Alcohol use: No    Alcohol/week: 0.0 standard  drinks of alcohol    Comment:     Drug use: Never   Sexual activity: Yes    Birth control/protection: Post-menopausal  Other Topics Concern   Not on file  Social History Narrative   Right handed   Social Determinants of Health   Financial Resource Strain: Not on file  Food Insecurity: Not on file  Transportation Needs: Not on file  Physical Activity: Not on file  Stress: Not on file  Social Connections: Not on file  Intimate Partner Violence: Not on file    Outpatient Medications Prior to Visit  Medication Sig Dispense Refill   acetaminophen (TYLENOL) 325 MG tablet Tylenol 325 mg tablet  Take 2 tablets by oral route.     albuterol (VENTOLIN HFA) 108 (90 Base) MCG/ACT inhaler Inhale 2 puffs into the lungs every 6 (six) hours as needed for  wheezing or shortness of breath. 1 each 0   benzonatate (TESSALON) 200 MG capsule Take 200 mg by mouth 3 (three) times daily as needed for cough.     fluticasone (FLONASE) 50 MCG/ACT nasal spray Place 2 sprays into both nostrils daily. 16 g 1   fluticasone (FLOVENT HFA) 220 MCG/ACT inhaler Inhale 1 puff into the lungs in the morning and at bedtime. (Patient taking differently: Inhale 1 puff into the lungs in the morning and at bedtime. As needed with illness) 1 each 1   HYDROcodone-homatropine (HYCODAN) 5-1.5 MG/5ML syrup Take 5 mLs by mouth every 8 (eight) hours as needed for cough. 120 mL 0   HYDROCORTISONE ACE, RECTAL, 30 MG SUPP Place 1 suppository (30 mg total) rectally at bedtime as needed. 28 each 1   OMEGA 3 1000 MG CAPS Take 1 capsule by mouth daily. Reported on 02/03/2016     ondansetron (ZOFRAN ODT) 4 MG disintegrating tablet Take 1 tablet (4 mg total) by mouth every 8 (eight) hours as needed for nausea or vomiting. 20 tablet 5   ondansetron (ZOFRAN) 4 MG tablet Take 1 tablet (4 mg total) by mouth every 8 (eight) hours as needed for nausea or vomiting. 20 tablet 0   OVER THE COUNTER MEDICATION Hypothalamus PMG     predniSONE (DELTASONE) 10 MG tablet Take 40 mg by mouth daily.     SUMAtriptan (IMITREX) 100 MG tablet Take 1 tablet (100 mg total) by mouth daily as needed for migraine. May repeat dose in 2 hours if no relief.  Do not exceed 2 doses in 24 hours. 10 tablet 2   triamterene-hydrochlorothiazide (DYAZIDE) 37.5-25 MG capsule Take 1 capsule by mouth daily as needed. Reported on 02/03/2016  1   NURTEC 75 MG TBDP TAKE 1 TABLET BY MOUTH AT THE EARLIEST ONSET OF MIGRAINE. MAX 1 TAB/24 HOURS. 8 tablet 5   fexofenadine (ALLEGRA ALLERGY) 180 MG tablet Take 1 tablet (180 mg total) by mouth daily for 15 days. 15 tablet 0   No facility-administered medications prior to visit.    Allergies  Allergen Reactions   Other Other (See Comments)   Latex Itching    Bandaids    Review of Systems   Constitutional:  Positive for malaise/fatigue. Negative for chills and fever.  HENT:  Negative for congestion and hearing loss.   Eyes:  Negative for discharge.  Respiratory:  Negative for cough, sputum production and shortness of breath.   Cardiovascular:  Negative for chest pain, palpitations and leg swelling.  Gastrointestinal:  Negative for abdominal pain, blood in stool, constipation, diarrhea, heartburn, nausea and vomiting.  Genitourinary:  Negative  for dysuria, frequency, hematuria and urgency.  Musculoskeletal:  Negative for back pain, falls and myalgias.  Skin:  Negative for rash.  Neurological:  Negative for dizziness, sensory change, loss of consciousness, weakness and headaches.  Endo/Heme/Allergies:  Negative for environmental allergies. Does not bruise/bleed easily.  Psychiatric/Behavioral:  Positive for depression. Negative for hallucinations, substance abuse and suicidal ideas. The patient is nervous/anxious. The patient does not have insomnia.        Objective:    Physical Exam Constitutional:      General: She is not in acute distress.    Appearance: Normal appearance. She is not diaphoretic.  HENT:     Head: Normocephalic and atraumatic.     Right Ear: Tympanic membrane, ear canal and external ear normal.     Left Ear: Tympanic membrane, ear canal and external ear normal.     Nose: Nose normal.     Mouth/Throat:     Mouth: Mucous membranes are moist.     Pharynx: Oropharynx is clear. No oropharyngeal exudate.  Eyes:     General: No scleral icterus.       Right eye: No discharge.        Left eye: No discharge.     Conjunctiva/sclera: Conjunctivae normal.     Pupils: Pupils are equal, round, and reactive to light.  Neck:     Thyroid: No thyromegaly.  Cardiovascular:     Rate and Rhythm: Normal rate and regular rhythm.     Heart sounds: Normal heart sounds. No murmur heard. Pulmonary:     Effort: Pulmonary effort is normal. No respiratory distress.      Breath sounds: Normal breath sounds. No wheezing or rales.  Abdominal:     General: Bowel sounds are normal. There is no distension.     Palpations: Abdomen is soft. There is no mass.     Tenderness: There is no abdominal tenderness.  Musculoskeletal:        General: No tenderness. Normal range of motion.     Cervical back: Normal range of motion and neck supple.  Lymphadenopathy:     Cervical: No cervical adenopathy.  Skin:    General: Skin is warm and dry.     Findings: No rash.  Neurological:     General: No focal deficit present.     Mental Status: She is alert and oriented to person, place, and time.     Cranial Nerves: No cranial nerve deficit.     Coordination: Coordination normal.     Deep Tendon Reflexes: Reflexes are normal and symmetric. Reflexes normal.  Psychiatric:        Mood and Affect: Mood normal.        Behavior: Behavior normal.        Thought Content: Thought content normal.        Judgment: Judgment normal.     BP 122/74 (BP Location: Left Arm, Patient Position: Sitting, Cuff Size: Normal)   Pulse 76   Temp 98 F (36.7 C) (Oral)   Resp 16   Ht 5\' 5"  (1.651 m)   Wt 132 lb 6.4 oz (60.1 kg)   LMP 12/14/2016 Comment: Priior the Cycle on 12/14/2016, Patient states that she hadn't had a cycle in at least six months.  SpO2 98%   BMI 22.03 kg/m  Wt Readings from Last 3 Encounters:  04/29/23 132 lb 6.4 oz (60.1 kg)  04/23/22 130 lb 12.8 oz (59.3 kg)  02/26/22 132 lb (59.9 kg)    Diabetic  Foot Exam - Simple   No data filed    Lab Results  Component Value Date   WBC 5.9 04/29/2023   HGB 14.1 04/29/2023   HCT 42.4 04/29/2023   PLT 203.0 04/29/2023   GLUCOSE 81 04/29/2023   CHOL 149 04/29/2023   TRIG 124.0 04/29/2023   HDL 53.60 04/29/2023   LDLCALC 71 04/29/2023   ALT 12 04/29/2023   AST 14 04/29/2023   NA 141 04/29/2023   K 4.1 04/29/2023   CL 104 04/29/2023   CREATININE 0.99 04/29/2023   BUN 24 (H) 04/29/2023   CO2 31 04/29/2023   TSH  1.21 04/29/2023    Lab Results  Component Value Date   TSH 1.21 04/29/2023   Lab Results  Component Value Date   WBC 5.9 04/29/2023   HGB 14.1 04/29/2023   HCT 42.4 04/29/2023   MCV 98.0 04/29/2023   PLT 203.0 04/29/2023   Lab Results  Component Value Date   NA 141 04/29/2023   K 4.1 04/29/2023   CO2 31 04/29/2023   GLUCOSE 81 04/29/2023   BUN 24 (H) 04/29/2023   CREATININE 0.99 04/29/2023   BILITOT 0.9 04/29/2023   ALKPHOS 67 04/29/2023   AST 14 04/29/2023   ALT 12 04/29/2023   PROT 6.4 04/29/2023   ALBUMIN 4.1 04/29/2023   CALCIUM 9.5 04/29/2023   ANIONGAP <5 (L) 02/11/2021   GFR 62.63 04/29/2023   Lab Results  Component Value Date   CHOL 149 04/29/2023   Lab Results  Component Value Date   HDL 53.60 04/29/2023   Lab Results  Component Value Date   LDLCALC 71 04/29/2023   Lab Results  Component Value Date   TRIG 124.0 04/29/2023   Lab Results  Component Value Date   CHOLHDL 3 04/29/2023   No results found for: "HGBA1C"     Assessment & Plan:  Preventative health care Assessment & Plan: Patient encouraged to maintain heart healthy diet, regular exercise, adequate sleep. Consider daily probiotics. Take medications as prescribed. Labs reviewed. Colonoscopy 02/2022 repeat in 10 years. Sees OB/GYN for pap will request and MGM 2023 repeat next year will request. Labs ordered and reviewed. Given and reviewed copy of ACP documents from [facility] Secretary of State and encouraged to complete and return   Orders: -     Lipid panel -     TSH -     VITAMIN D 25 Hydroxy (Vit-D Deficiency, Fractures)  Chronic migraine without aura without status migrainosus, not intractable Assessment & Plan: Nurtec is helping more now after dietary changes. Refills given today  Orders: -     CBC with Differential/Platelet -     Comprehensive metabolic panel -     Sedimentation rate -     VITAMIN D 25 Hydroxy (Vit-D Deficiency, Fractures)  Tick bite, unspecified site, initial  encounter Assessment & Plan: Patient reports recently being diagnosed with Alpha gal and she has made the required dietary restrictions. Will run tick bourne illness titers.   Orders: -     Alpha-Gal Panel -     Ehrlichia antibody panel -     B. burgdorfi antibodies -     Rocky mtn spotted fvr abs pnl(IgG+IgM) -     Sedimentation rate -     VITAMIN D 25 Hydroxy (Vit-D Deficiency, Fractures)  Palpitations -     Comprehensive metabolic panel -     Lipid panel -     TSH -     Sedimentation rate -  VITAMIN D 25 Hydroxy (Vit-D Deficiency, Fractures)  Depression, unspecified depression type Assessment & Plan: Will add Buspar 5 mg 2-3 x daily and reevaluate  Orders: -     VITAMIN D 25 Hydroxy (Vit-D Deficiency, Fractures)  Intractable migraine without aura and without status migrainosus Assessment & Plan: Nurtec is helping more now after dietary changes. Refills given today  Orders: -     Nurtec; TAKE 1 TABLET BY MOUTH AT THE EARLIEST ONSET OF MIGRAINE. MAX 1 TAB/24 HOURS.  Dispense: 8 tablet; Refill: 5  Depression with anxiety Assessment & Plan: Will add Buspar 5 mg 2-3 x daily and reevaluate   Allergy to alpha-gal  Other orders -     ALPRAZolam; Take 1 tablet (0.25 mg total) by mouth 2 (two) times daily as needed for anxiety.  Dispense: 10 tablet; Refill: 0 -     busPIRone HCl; Take 1 tablet (5 mg total) by mouth 3 (three) times daily.  Dispense: 90 tablet; Refill: 2 -     Interpretation:    Assessment and Plan    Alpha-gal Syndrome: Recent positive test result with symptoms of fatigue, rash, and headaches. Patient also reports difficulty healing from tick bites. -Order labs for Lyme, [facility] Spotted Fever, and recheck Alpha-gal. -Consider referral to allergist for further evaluation if symptoms persist.  Depression/Anxiety: Patient reports increased stress, fatigue, and decreased appetite. Discussed the benefits and side effects of various treatment  options. -Start Buspar 5mg  twice daily, with the option to increase to three times daily if tolerated and needed. -Provide a prescription for Xanax to be used as needed for panic attacks. -Schedule follow-up in three months to assess efficacy of treatment.  Migraine: Patient reports recurrent headaches. -Prescribe Nurtec as needed for migraines.  General Health Maintenance: -Obtain recent mammogram and Pap smear results for review. -Consider Shingrix vaccination due to increased stress and potential for shingles outbreak. -Note that Tetanus vaccine will be due in 2025, but can be given earlier if an injury occurs.  Follow-up in three months to assess response to Buspar and ongoing symptoms related to Alpha-gal Syndrome.         Danise Edge, MD

## 2023-04-29 ENCOUNTER — Ambulatory Visit (INDEPENDENT_AMBULATORY_CARE_PROVIDER_SITE_OTHER): Payer: 59 | Admitting: Family Medicine

## 2023-04-29 ENCOUNTER — Encounter: Payer: Self-pay | Admitting: Family Medicine

## 2023-04-29 VITALS — BP 122/74 | HR 76 | Temp 98.0°F | Resp 16 | Ht 65.0 in | Wt 132.4 lb

## 2023-04-29 DIAGNOSIS — R002 Palpitations: Secondary | ICD-10-CM

## 2023-04-29 DIAGNOSIS — W57XXXA Bitten or stung by nonvenomous insect and other nonvenomous arthropods, initial encounter: Secondary | ICD-10-CM

## 2023-04-29 DIAGNOSIS — F418 Other specified anxiety disorders: Secondary | ICD-10-CM

## 2023-04-29 DIAGNOSIS — F32A Depression, unspecified: Secondary | ICD-10-CM

## 2023-04-29 DIAGNOSIS — G43019 Migraine without aura, intractable, without status migrainosus: Secondary | ICD-10-CM

## 2023-04-29 DIAGNOSIS — G43709 Chronic migraine without aura, not intractable, without status migrainosus: Secondary | ICD-10-CM

## 2023-04-29 DIAGNOSIS — Z0000 Encounter for general adult medical examination without abnormal findings: Secondary | ICD-10-CM

## 2023-04-29 DIAGNOSIS — Z91018 Allergy to other foods: Secondary | ICD-10-CM

## 2023-04-29 MED ORDER — BUSPIRONE HCL 5 MG PO TABS: 5.0000 mg | ORAL_TABLET | Freq: Three times a day (TID) | ORAL | 2 refills | Status: DC

## 2023-04-29 MED ORDER — NURTEC 75 MG PO TBDP: ORAL_TABLET | ORAL | 5 refills | Status: AC

## 2023-04-29 MED ORDER — ALPRAZOLAM 0.25 MG PO TABS: 0.2500 mg | ORAL_TABLET | Freq: Two times a day (BID) | ORAL | 0 refills | Status: DC | PRN

## 2023-04-29 NOTE — Patient Instructions (Addendum)
Anhedonia  Shingrix is the new shingles shot, 2 shots over 2-6 months, confirm coverage with insurance and document, then can return here for shots with nurse appt or at pharmacy    Preventive Care 58-59 Years Old, Female Preventive care refers to lifestyle choices and visits with your health care provider that can promote health and wellness. Preventive care visits are also called wellness exams. What can I expect for my preventive care visit? Counseling Your health care provider may ask you questions about your: Medical history, including: Past medical problems. Family medical history. Pregnancy history. Current health, including: Menstrual cycle. Method of birth control. Emotional well-being. Home life and relationship well-being. Sexual activity and sexual health. Lifestyle, including: Alcohol, nicotine or tobacco, and drug use. Access to firearms. Diet, exercise, and sleep habits. Work and work Astronomer. Sunscreen use. Safety issues such as seatbelt and bike helmet use. Physical exam Your health care provider will check your: Height and weight. These may be used to calculate your BMI (body mass index). BMI is a measurement that tells if you are at a healthy weight. Waist circumference. This measures the distance around your waistline. This measurement also tells if you are at a healthy weight and may help predict your risk of certain diseases, such as type 2 diabetes and high blood pressure. Heart rate and blood pressure. Body temperature. Skin for abnormal spots. What immunizations do I need?  Vaccines are usually given at various ages, according to a schedule. Your health care provider will recommend vaccines for you based on your age, medical history, and lifestyle or other factors, such as travel or where you work. What tests do I need? Screening Your health care provider may recommend screening tests for certain conditions. This may include: Lipid and cholesterol  levels. Diabetes screening. This is done by checking your blood sugar (glucose) after you have not eaten for a while (fasting). Pelvic exam and Pap test. Hepatitis B test. Hepatitis C test. HIV (human immunodeficiency virus) test. STI (sexually transmitted infection) testing, if you are at risk. Lung cancer screening. Colorectal cancer screening. Mammogram. Talk with your health care provider about when you should start having regular mammograms. This may depend on whether you have a family history of breast cancer. BRCA-related cancer screening. This may be done if you have a family history of breast, ovarian, tubal, or peritoneal cancers. Bone density scan. This is done to screen for osteoporosis. Talk with your health care provider about your test results, treatment options, and if necessary, the need for more tests. Follow these instructions at home: Eating and drinking  Eat a diet that includes fresh fruits and vegetables, whole grains, lean protein, and low-fat dairy products. Take vitamin and mineral supplements as recommended by your health care provider. Do not drink alcohol if: Your health care provider tells you not to drink. You are pregnant, may be pregnant, or are planning to become pregnant. If you drink alcohol: Limit how much you have to 0-1 drink a day. Know how much alcohol is in your drink. In the [place] one drink equals one 12 oz bottle of beer (355 mL), one 5 oz glass of wine (148 mL), or one 1 oz glass of hard liquor (44 mL). Lifestyle Brush your teeth every morning and night with fluoride toothpaste. Floss one time each day. Exercise for at least 30 minutes 5 or more days each week. Do not use any products that contain nicotine or tobacco. These products include cigarettes, chewing tobacco, and vaping devices,  such as e-cigarettes. If you need help quitting, ask your health care provider. Do not use drugs. If you are sexually active, practice safe sex. Use a  condom or other form of protection to prevent STIs. If you do not wish to become pregnant, use a form of birth control. If you plan to become pregnant, see your health care provider for a prepregnancy visit. Take aspirin only as told by your health care provider. Make sure that you understand how much to take and what form to take. Work with your health care provider to find out whether it is safe and beneficial for you to take aspirin daily. Find healthy ways to manage stress, such as: Meditation, yoga, or listening to music. Journaling. Talking to a trusted person. Spending time with friends and family. Minimize exposure to UV radiation to reduce your risk of skin cancer. Safety Always wear your seat belt while driving or riding in a vehicle. Do not drive: If you have been drinking alcohol. Do not ride with someone who has been drinking. When you are tired or distracted. While texting. If you have been using any mind-altering substances or drugs. Wear a helmet and other protective equipment during sports activities. If you have firearms in your house, make sure you follow all gun safety procedures. Seek help if you have been physically or sexually abused. What's next? Visit your health care provider once a year for an annual wellness visit. Ask your health care provider how often you should have your eyes and teeth checked. Stay up to date on all vaccines. This information is not intended to replace advice given to you by your health care provider. Make sure you discuss any questions you have with your health care provider. Document Revised: 04/02/2021 Document Reviewed: 04/02/2021 Elsevier Patient Education  2024 [employer].

## 2023-04-30 ENCOUNTER — Other Ambulatory Visit: Payer: Self-pay

## 2023-04-30 LAB — CBC WITH DIFFERENTIAL/PLATELET
Basophils Absolute: 0 10*3/uL (ref 0.0–0.1)
Basophils Relative: 0.6 % (ref 0.0–3.0)
Eosinophils Absolute: 0.1 10*3/uL (ref 0.0–0.7)
Eosinophils Relative: 1.9 % (ref 0.0–5.0)
HCT: 42.4 % (ref 36.0–46.0)
Hemoglobin: 14.1 g/dL (ref 12.0–15.0)
Lymphocytes Relative: 33.5 % (ref 12.0–46.0)
Lymphs Abs: 2 10*3/uL (ref 0.7–4.0)
MCHC: 33.3 g/dL (ref 30.0–36.0)
MCV: 98 fl (ref 78.0–100.0)
Monocytes Absolute: 0.4 10*3/uL (ref 0.1–1.0)
Monocytes Relative: 6.2 % (ref 3.0–12.0)
Neutro Abs: 3.4 10*3/uL (ref 1.4–7.7)
Neutrophils Relative %: 57.8 % (ref 43.0–77.0)
Platelets: 203 10*3/uL (ref 150.0–400.0)
RBC: 4.32 Mil/uL (ref 3.87–5.11)
RDW: 13 % (ref 11.5–15.5)
WBC: 5.9 10*3/uL (ref 4.0–10.5)

## 2023-04-30 LAB — COMPREHENSIVE METABOLIC PANEL
ALT: 12 U/L (ref 0–35)
AST: 14 U/L (ref 0–37)
Albumin: 4.1 g/dL (ref 3.5–5.2)
Alkaline Phosphatase: 67 U/L (ref 39–117)
BUN: 24 mg/dL — ABNORMAL HIGH (ref 6–23)
CO2: 31 mEq/L (ref 19–32)
Calcium: 9.5 mg/dL (ref 8.4–10.5)
Chloride: 104 mEq/L (ref 96–112)
Creatinine, Ser: 0.99 mg/dL (ref 0.40–1.20)
GFR: 62.63 mL/min (ref >60.00)
Glucose, Bld: 81 mg/dL (ref 70–99)
Potassium: 4.1 mEq/L (ref 3.5–5.1)
Sodium: 141 mEq/L (ref 135–145)
Total Bilirubin: 0.9 mg/dL (ref 0.2–1.2)
Total Protein: 6.4 g/dL (ref 6.0–8.3)

## 2023-04-30 LAB — TSH: TSH: 1.21 u[IU]/mL (ref 0.35–5.50)

## 2023-04-30 LAB — LIPID PANEL
Cholesterol: 149 mg/dL (ref 0–200)
HDL: 53.6 mg/dL (ref >39.00)
LDL Cholesterol: 71 mg/dL (ref 0–99)
NonHDL: 95.56
Total CHOL/HDL Ratio: 3
Triglycerides: 124 mg/dL (ref 0.0–149.0)
VLDL: 24.8 mg/dL (ref 0.0–40.0)

## 2023-04-30 LAB — SEDIMENTATION RATE: Sed Rate: 2 mm/hr (ref 0–30)

## 2023-04-30 LAB — VITAMIN D 25 HYDROXY (VIT D DEFICIENCY, FRACTURES): VITD: 21.03 ng/mL — ABNORMAL LOW (ref 30.00–100.00)

## 2023-04-30 MED ORDER — VITAMIN D (ERGOCALCIFEROL) 1.25 MG (50000 UNIT) PO CAPS: 50000.0000 [IU] | ORAL_CAPSULE | ORAL | 4 refills | Status: DC

## 2023-05-02 NOTE — Assessment & Plan Note (Signed)
Nurtec is helping more now after dietary changes. Refills given today

## 2023-05-02 NOTE — Assessment & Plan Note (Signed)
Patient reports recently being diagnosed with Alpha gal and she has made the required dietary restrictions. Will run tick bourne illness titers.

## 2023-05-02 NOTE — Assessment & Plan Note (Signed)
Will add Buspar 5 mg 2-3 x daily and reevaluate

## 2023-05-03 ENCOUNTER — Telehealth: Payer: Self-pay

## 2023-05-03 NOTE — Telephone Encounter (Signed)
PA initiated via Covermymeds; KEY: [id number].   [employer] does not manage PA for this patient. Please contact the number on the back of the members card for further assistance

## 2023-05-04 LAB — ALPHA-GAL PANEL
Allergen, Mutton, f88: 0.14 kU/L — ABNORMAL HIGH
Allergen, Pork, f26: 0.12 kU/L — ABNORMAL HIGH
Beef: 0.31 kU/L — ABNORMAL HIGH
GALACTOSE-ALPHA-1,3-GALACTOSE IGE*: 1.8 kU/L — ABNORMAL HIGH (ref <0.10)

## 2023-05-04 LAB — EHRLICHIA ANTIBODY PANEL
E. CHAFFEENSIS AB IGG: <1:64 {titer}
E. CHAFFEENSIS AB IGM: <1:20 {titer}

## 2023-05-04 LAB — ROCKY MTN SPOTTED FVR ABS PNL(IGG+IGM)
RMSF IgG: NOT DETECTED
RMSF IgM: NOT DETECTED

## 2023-05-04 LAB — INTERPRETATION:

## 2023-05-04 LAB — B. BURGDORFI ANTIBODIES: B burgdorferi Ab IgG+IgM: <0.9 index

## 2023-05-26 ENCOUNTER — Telehealth: Payer: Self-pay

## 2023-05-26 ENCOUNTER — Other Ambulatory Visit (HOSPITAL_COMMUNITY): Payer: Self-pay

## 2023-05-26 NOTE — Telephone Encounter (Signed)
Pharmacy Patient Advocate Encounter   Received notification from CoverMyMeds that prior authorization for NURTEC ODT 75 MG TABLET is required/requested.   Insurance verification completed.   The patient is insured through [employer] .   Per test claim: PA required; PA submitted to [employer] via rxb.[email or web address] Key/confirmation #/[id number] Status is pending

## 2023-05-27 NOTE — Telephone Encounter (Signed)
Pharmacy Patient Advocate Encounter  Received notification from [employer] that Prior Authorization for Nurtec has been APPROVED from 05/26/23 to 05/24/24   PA #/Case ID/Reference #: [id number]

## 2023-05-31 NOTE — Telephone Encounter (Signed)
Noted  

## 2023-08-19 ENCOUNTER — Telehealth: Payer: 59 | Admitting: Family Medicine

## 2023-09-02 ENCOUNTER — Ambulatory Visit: Payer: 59 | Admitting: Family Medicine

## 2023-09-02 ENCOUNTER — Encounter: Payer: Self-pay | Admitting: Family Medicine

## 2023-09-02 VITALS — BP 118/80 | HR 75 | Ht 65.0 in | Wt 135.0 lb

## 2023-09-02 DIAGNOSIS — F418 Other specified anxiety disorders: Secondary | ICD-10-CM | POA: Diagnosis not present

## 2023-09-02 DIAGNOSIS — Z91018 Allergy to other foods: Secondary | ICD-10-CM | POA: Diagnosis not present

## 2023-09-02 DIAGNOSIS — E559 Vitamin D deficiency, unspecified: Secondary | ICD-10-CM

## 2023-09-02 MED ORDER — EPINEPHRINE 0.3 MG/0.3ML IJ SOAJ
0.3000 mg | INTRAMUSCULAR | 1 refills | Status: AC | PRN
Start: 2023-09-02 — End: ?

## 2023-09-02 NOTE — Assessment & Plan Note (Signed)
History of panic attacks during a period of significant stress. Currently has Xanax and Buspar -Not needing currently, but will keep on hand in case of triggered anxiety with upcoming holidays, anniversaries, etc

## 2023-09-02 NOTE — Progress Notes (Signed)
Established Patient Office Visit  Subjective   Patient ID: Nicole Lynch, female    DOB: [birth year]  Age: 57 y.o. MRN: [id number]  Chief Complaint  Patient presents with   Medical Management of Chronic Issues    HPI  Discussed the use of AI scribe software for clinical note transcription with the patient, who gave verbal consent to proceed.  History of Present Illness   The patient presents for a routine follow-up. They report a history of alpha-gal allergy, with a significantly elevated level on previous testing, and are concerned about the trajectory of this condition. They have not consumed any mammalian meat since the diagnosis and are curious about the need for an EpiPen. They also report a history of migraines, which can be triggered by gluten and alpha-gal, and are managed with Nurtec.  The patient also has a history of low vitamin D levels, for which they completed a 12-week course of high-dose supplementation and have since been on daily over-the-counter supplementation.   In addition, the patient reports a history of panic attacks, which were triggered by significant life stressors. They have been prescribed Xanax and Buspar for these episodes, but she has not been needing lately.           ROS All review of systems negative except what is listed in the HPI    Objective:     BP 118/80   Pulse 75   Ht 5\' 5"  (1.651 m)   Wt 135 lb (61.2 kg)   LMP 12/14/2016 Comment: Priior the Cycle on 12/14/2016, Patient states that she hadn't had a cycle in at least six months.  SpO2 100%   BMI 22.47 kg/m    Physical Exam Vitals reviewed.  Constitutional:      Appearance: Normal appearance.  Cardiovascular:     Rate and Rhythm: Normal rate.  Pulmonary:     Effort: Pulmonary effort is normal.     Breath sounds: Normal breath sounds.  Neurological:     General: No focal deficit present.     Mental Status: She is alert and oriented to person, place, and time.   Psychiatric:        Mood and Affect: Mood normal.        Behavior: Behavior normal.        Thought Content: Thought content normal.        Judgment: Judgment normal.         No results found for any visits on 09/02/23.    The 10-year ASCVD risk score (Arnett DK, et al., 2019) is: 2.7%    Assessment & Plan:   Problem List Items Addressed This Visit       Active Problems   Depression with anxiety    History of panic attacks during a period of significant stress. Currently has Xanax and Buspar -Not needing currently, but will keep on hand in case of triggered anxiety with upcoming holidays, anniversaries, etc       Vitamin D deficiency    Previously low levels, completed 12 weeks of high-dose supplementation and currently on daily over-the-counter supplementation. -Lab closed at this time. Order future Vitamin D level to be drawn at patient's convenience.      Relevant Orders   Vitamin D (25 hydroxy)   Allergy to alpha-gal - Primary    High alpha-gal levels with symptoms of nausea, rash, and headaches. Patient has been strictly avoiding mammalian meat. Uncertainty about the trajectory of alpha-gal levels and need for EpiPen. -  Refer to Allergy team for further management and monitoring. -Prescribe EpiPen to be filled at [facility].      Relevant Medications   EPINEPHrine 0.3 mg/0.3 mL IJ SOAJ injection   Other Relevant Orders   Ambulatory referral to Allergy    Return in about 3 months (around 12/03/2023) for routine follow-up.    Clayborne Dana, NP

## 2023-09-02 NOTE — Assessment & Plan Note (Signed)
High alpha-gal levels with symptoms of nausea, rash, and headaches. Patient has been strictly avoiding mammalian meat. Uncertainty about the trajectory of alpha-gal levels and need for EpiPen. -Refer to Allergy team for further management and monitoring. -Prescribe EpiPen to be filled at [facility].

## 2023-09-02 NOTE — Assessment & Plan Note (Signed)
Previously low levels, completed 12 weeks of high-dose supplementation and currently on daily over-the-counter supplementation. -Lab closed at this time. Order future Vitamin D level to be drawn at patient's convenience.

## 2023-09-13 NOTE — Progress Notes (Unsigned)
New Patient Note  RE: Nicole Lynch MRN: [id number] DOB: 11-13-1965 Date of Office Visit: 09/14/2023  Consult requested by: Clayborne Dana, NP Primary care provider: Bradd Canary, MD  Chief Complaint: No chief complaint on file.  History of Present Illness: I had the pleasure of seeing Nicole Lynch for [facility]  on 09/13/2023. She is a 57 y.o. female, who is referred here by Bradd Canary, MD for the evaluation of alpha gal allergy.  Discussed the use of AI scribe software for clinical note transcription with the patient, who gave verbal consent to proceed.  History of Present Illness             She reports food allergy to ***. The reaction occurred at the age of ***, after she ate *** amount of ***. Symptoms started within *** and was in the form of *** hives, swelling, wheezing, abdominal pain, diarrhea, vomiting. ***Denies any associated cofactors such as exertion, infection, NSAID use, or alcohol consumption. The symptoms lasted for ***. She was evaluated in ED and received ***. Since this episode, she does *** not report other accidental exposures to ***. She does *** not have access to epinephrine autoinjector and *** needed to use it.   Past work up includes: ***. Dietary History: patient has been eating other foods including ***milk, ***eggs, ***peanut, ***treenuts, ***sesame, ***shellfish, ***fish, ***soy, ***wheat, ***meats, ***fruits and ***vegetables.  She reports reading labels and avoiding *** in diet completely. She tolerates ***baked egg and baked milk products.   09/02/2023 PCP visit: "High alpha-gal levels with symptoms of nausea, rash, and headaches. Patient has been strictly avoiding mammalian meat. Uncertainty about the trajectory of alpha-gal levels and need for EpiPen."   Component     Latest Ref Rng 04/29/2023  Beef     kU/L 0.31 (H)   Class 0/1   Class 0/1   Allergen, Mutton, f88     kU/L 0.14 (H)    Class 0/1   Allergen, Pork, f26     kU/L 0.12 (H)   GALACTOSE-ALPHA-1,3-GALACTOSE IGE*     <0.10 kU/L 1.80 (H)     Assessment and Plan: Nicole Lynch is a 57 y.o. female with: ***  Assessment and Plan               No follow-ups on file.  No orders of the defined types were placed in this encounter.  Lab Orders  No laboratory test(s) ordered today    Other allergy screening: Asthma: {Blank single:19197::"yes","no"} Rhino conjunctivitis: {Blank single:19197::"yes","no"} Food allergy: {Blank single:19197::"yes","no"} Medication allergy: {Blank single:19197::"yes","no"} Hymenoptera allergy: {Blank single:19197::"yes","no"} Urticaria: {Blank single:19197::"yes","no"} Eczema:{Blank single:19197::"yes","no"} History of recurrent infections suggestive of immunodeficency: {Blank single:19197::"yes","no"}  Diagnostics: Spirometry:  Tracings reviewed. Her effort: {Blank single:19197::"Good reproducible efforts.","It was hard to get consistent efforts and there is a question as to whether this reflects a maximal maneuver.","Poor effort, data can not be interpreted."} FVC: ***L FEV1: ***L, ***% predicted FEV1/FVC ratio: ***% Interpretation: {Blank single:19197::"Spirometry consistent with mild obstructive disease","Spirometry consistent with moderate obstructive disease","Spirometry consistent with severe obstructive disease","Spirometry consistent with possible restrictive disease","Spirometry consistent with mixed obstructive and restrictive disease","Spirometry uninterpretable due to technique","Spirometry consistent with normal pattern","No overt abnormalities noted given today's efforts"}.  Please see scanned spirometry results for details.  Skin Testing: {Blank single:19197::"Select foods","Environmental allergy panel","Environmental allergy panel and select foods","Food allergy panel","None","Deferred due to recent antihistamines use"}. *** Results discussed with  patient/family.   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   Palpitations 08/31/2014   Skin cancer 06/29/2011   Allergy    Chicken pox    Depression with anxiety    Migraine    Past Medical History:  Diagnosis Date   Abdominal bloating 08/16/2017   Allergy    seasonal, gluten   Chicken pox as a child   Depression 2005   postpartum    Meniere disease, 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     albuterol (VENTOLIN HFA) 108 (90 Base) MCG/ACT inhaler Inhale 2 puffs into the lungs every 6 (six) hours as needed for wheezing or shortness of breath. 1 each 0   benzonatate (TESSALON) 200 MG capsule Take 200 mg by mouth 3 (three) times daily as needed for cough.     EPINEPHrine 0.3 mg/0.3 mL IJ SOAJ injection Inject 0.3 mg into the muscle as needed for anaphylaxis. 1 each 1   fexofenadine (ALLEGRA ALLERGY) 180 MG tablet Take 1 tablet (180 mg total) by mouth daily for 15 days. 15 tablet 0   fluticasone (FLONASE) 50 MCG/ACT nasal spray Place 2 sprays into both nostrils daily. 16 g 1   fluticasone  (FLOVENT HFA) 220 MCG/ACT inhaler Inhale 1 puff into the lungs in the morning and at bedtime. (Patient taking differently: Inhale 1 puff into the lungs in the morning and at bedtime. As needed with illness) 1 each 1   HYDROCORTISONE ACE, RECTAL, 30 MG SUPP Place 1 suppository (30 mg total) rectally at bedtime as needed. 28 each 1   OMEGA 3 1000 MG CAPS Take 1 capsule by mouth daily. Reported on 02/03/2016     ondansetron (ZOFRAN ODT) 4 MG disintegrating tablet Take 1 tablet (4 mg total) by mouth every 8 (eight) hours as needed for nausea or vomiting. 20 tablet 5   OVER THE COUNTER MEDICATION Hypothalamus PMG     Rimegepant Sulfate (NURTEC) 75 MG TBDP TAKE 1 TABLET BY MOUTH AT THE EARLIEST ONSET OF MIGRAINE. MAX 1 TAB/24 HOURS. 8 tablet 5   SUMAtriptan (IMITREX) 100 MG tablet Take 1 tablet (100 mg total) by mouth daily as needed for migraine. May repeat dose in 2 hours if no relief.  Do not exceed 2 doses in 24 hours. 10 tablet 2   triamterene-hydrochlorothiazide (DYAZIDE) 37.5-25 MG capsule Take 1 capsule by mouth daily as needed. Reported on 02/03/2016  1   Vitamin D, Ergocalciferol, (DRISDOL) 1.25 MG (50000 UNIT) CAPS capsule Take 1 capsule (50,000 Units total) by mouth every 7 (seven) days. 4 capsule 4   No current facility-administered medications for this visit.  Allergies: Allergies  Allergen Reactions   Other Other (See Comments)   Latex Itching    Bandaids   Social History: Social History   Socioeconomic History   Marital status: Married    Spouse name: Not on file   Number of children: 2   Years of education: Not on file   Highest education level: Not on file  Occupational History   Occupation: Building control surveyor   Tobacco Use   Smoking status: Never    Passive exposure: Past   Smokeless tobacco: Never  Vaping Use   Vaping status: Never Used  Substance and Sexual Activity   Alcohol use: No    Alcohol/week: 0.0 standard drinks of alcohol    Comment:     Drug use: Never    Sexual activity: Yes    Birth control/protection: Post-menopausal  Other Topics Concern   Not on file  Social History Narrative   Right handed   Social Determinants of Health   Financial Resource Strain: Not on file  Food Insecurity: Not on file  Transportation Needs: Not on file  Physical Activity: Not on file  Stress: Not on file  Social Connections: Not on file   Lives in a ***. Smoking: *** Occupation: ***  Environmental HistorySurveyor, minerals in the house: Copywriter, advertising in the family room: {Blank single:19197::"yes","no"} Carpet in the bedroom: {Blank single:19197::"yes","no"} Heating: {Blank single:19197::"electric","gas","heat pump"} Cooling: {Blank single:19197::"central","window","heat pump"} Pet: {Blank single:19197::"yes ***","no"}  Family History: Family History  Problem Relation Age of Onset   Vision loss Mother        macular degeneration   Heart disease Father        afib on blood thinner   Cancer Father        prostate cancer   Urinary tract infection Father    Colon cancer Brother    Colon polyps Neg Hx    Esophageal cancer Neg Hx    Rectal cancer Neg Hx    Stomach cancer Neg Hx    Problem                               Relation Asthma                                   *** Eczema                                *** Food allergy                          *** Allergic rhino conjunctivitis     ***  Review of Systems  Constitutional:  Negative for appetite change, chills, fever and unexpected weight change.  HENT:  Negative for congestion and rhinorrhea.   Eyes:  Negative for itching.  Respiratory:  Negative for cough, chest tightness, shortness of breath and wheezing.   Cardiovascular:  Negative for chest pain.  Gastrointestinal:  Negative for abdominal pain.  Genitourinary:  Negative for difficulty urinating.  Skin:  Negative for rash.  Neurological:  Negative for headaches.    Objective: LMP 12/14/2016  Comment: Priior the Cycle on 12/14/2016, Patient states that she hadn't had a cycle in at least six months. There is no height or weight on file to calculate BMI. Physical Exam  Vitals and nursing note reviewed.  Constitutional:      Appearance: Normal appearance. She is well-developed.  HENT:     Head: Normocephalic and atraumatic.     Right Ear: Tympanic membrane and external ear normal.     Left Ear: Tympanic membrane and external ear normal.     Nose: Nose normal.     Mouth/Throat:     Mouth: Mucous membranes are moist.     Pharynx: Oropharynx is clear.  Eyes:     Conjunctiva/sclera: Conjunctivae normal.  Cardiovascular:     Rate and Rhythm: Normal rate and regular rhythm.     Heart sounds: Normal heart sounds. No murmur heard.    No friction rub. No gallop.  Pulmonary:     Effort: Pulmonary effort is normal.     Breath sounds: Normal breath sounds. No wheezing, rhonchi or rales.  Musculoskeletal:     Cervical back: Neck supple.  Skin:    General: Skin is warm.     Findings: No rash.  Neurological:     Mental Status: She is alert and oriented to person, place, and time.  Psychiatric:        Behavior: Behavior normal.    The plan was reviewed with the patient/family, and all questions/concerned were addressed.  It was my pleasure to see Nicole Lynch today and participate in her care. Please feel free to contact me with any questions or concerns.  Sincerely,  Wyline Mood, DO Allergy & Immunology  [facility] office: [phone] [facility] office: [phone]

## 2023-09-14 ENCOUNTER — Encounter: Payer: Self-pay | Admitting: Allergy

## 2023-09-14 ENCOUNTER — Ambulatory Visit (INDEPENDENT_AMBULATORY_CARE_PROVIDER_SITE_OTHER): Payer: Managed Care, Other (non HMO) | Admitting: Allergy

## 2023-09-14 VITALS — BP 138/86 | HR 63 | Temp 98.0°F | Resp 16 | Ht 65.0 in | Wt 132.0 lb

## 2023-09-14 DIAGNOSIS — Z91018 Allergy to other foods: Secondary | ICD-10-CM

## 2023-09-14 DIAGNOSIS — T63481D Toxic effect of venom of other arthropod, accidental (unintentional), subsequent encounter: Secondary | ICD-10-CM | POA: Diagnosis not present

## 2023-09-14 DIAGNOSIS — Z8669 Personal history of other diseases of the nervous system and sense organs: Secondary | ICD-10-CM | POA: Diagnosis not present

## 2023-09-14 DIAGNOSIS — T63481A Toxic effect of venom of other arthropod, accidental (unintentional), initial encounter: Secondary | ICD-10-CM

## 2023-09-14 DIAGNOSIS — R058 Other specified cough: Secondary | ICD-10-CM

## 2023-09-14 DIAGNOSIS — Z9109 Other allergy status, other than to drugs and biological substances: Secondary | ICD-10-CM

## 2023-09-14 NOTE — Patient Instructions (Addendum)
Alpha gal allergy Continue strict avoidance of all mammalian meat. Try to avoid tick bites.  See handout.  or mild symptoms you can take over the counter antihistamines such as Benadryl 1-2 tablets = 25-50mg  and monitor symptoms closely. If symptoms worsen or if you have severe symptoms including breathing issues, throat closure, significant swelling, whole body hives, severe diarrhea and vomiting, lightheadedness then inject epinephrine and seek immediate medical care afterwards. Emergency action plan given. Get bloodwork 1-2 weeks prior to next visit.  We are ordering labs, so please allow 1-2 weeks for the results to come back. With the newly implemented Cures Act, the labs might be visible to you at the same time that they become visible to me. However, I will not address the results until all of the results are back, so please be patient.  In the meantime, continue recommendations in your patient instructions, including avoidance measures (if applicable), until you hear from me.  Migraines See handout. Continue your medications as needed.   Localized reaction to bee stings Continue to avoid. See handout.   Coughing Monitor symptoms. Consider getting breathing test. May use albuterol nebulizer every 4 to 6 hours as needed for shortness of breath, chest tightness, coughing, and wheezing.  Monitor frequency of use - if you need to use it more than twice per week on a consistent basis let us know.   Environmental allergies  Return for allergy skin testing if interested. Will make additional recommendations based on results. Make sure you don't take any antihistamines for 3 days before the skin testing appointment. Don't put any lotion on the back and arms on the day of testing.  Plan on being here for 30-60 minutes.   Follow up in 6 months or sooner if needed.

## 2023-11-21 ENCOUNTER — Other Ambulatory Visit: Payer: Self-pay | Admitting: Family Medicine

## 2023-11-22 NOTE — Telephone Encounter (Signed)
Requesting: alprazolam 0.25mg   Contract:  None UDS: None Last Visit: 09/02/23 w/ Ladona Ridgel  Next Visit: None Last Refill: Unsure last refill    Please Advise

## 2024-03-15 NOTE — Progress Notes (Unsigned)
 Follow Up Note  RE: Nicole Lynch MRN: [id number] DOB: 10/08/1966 Date of Office Visit: 03/16/2024  Referring provider: Neda Balk, MD Primary care provider: Neda Balk, MD  Chief Complaint: No chief complaint on file.  History of Present Illness: I had the pleasure of seeing Nicole Lynch for a follow up visit at the [facility] on 03/15/2024. She is a 58 y.o. 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 Her previous allergy office visit was on 09/14/2023 with Dr. Burdette Carolin. Today is a regular follow up visit.  Discussed the use of AI scribe software for clinical note transcription with the patient, who gave verbal consent to proceed.  History of Present Illness            ***  Assessment and Plan: Nicole Lynch is a 58 y.o. female with: Allergy to alpha-gal Positive alpha-gal test x 2 after tick bites, presenting with rash, vomiting, and migraines. Patient has been avoiding mammalian meat since diagnosis. No issues with dairy/gelatin products.  Continue strict avoidance of all mammalian meat. Try to avoid tick bites.  See handout on alpha gal. For mild symptoms you can take over the counter antihistamines such as Benadryl  1-2 tablets = 25-50mg  and monitor symptoms closely. If symptoms worsen or if you have severe symptoms including breathing issues, throat closure, significant swelling, whole body hives, severe diarrhea and vomiting, lightheadedness then inject epinephrine  and seek immediate medical care afterwards. Emergency action plan given. Get bloodwork 1-2 weeks prior to next visit.    Hx of migraines Patient has a personal treatment regimen that usually works. See handout on migraine and triggers. Continue your medications as needed.    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 See handout. Continue your medications as needed.     Coughing Usually has to use nebulizer machine to control coughing in the colder months.  Monitor symptoms. Consider getting breathing test. May use albuterol  nebulizer every 4 to 6 hours as needed for shortness of breath, chest tightness, coughing, and wheezing.  Monitor frequency of use - if you need to use it more than twice per week on a consistent basis let us  know.    Environmental allergies  Return for allergy skin testing if interested.   Assessment and Plan              No follow-ups on file.  No orders of the defined types were placed in this encounter.  Lab Orders  No laboratory test(s) ordered today    Diagnostics: Spirometry:  Tracings reviewed. Her effort: {Blank single:19197::"Good reproducible efforts.","It was hard to get consistent efforts and there is a question as to whether this reflects a maximal maneuver.","Poor effort, data can not be interpreted."} FVC: ***L FEV1: ***L, ***% predicted FEV1/FVC ratio: ***% Interpretation: {Blank single:19197::"Spirometry consistent with mild obstructive disease","Spirometry consistent with moderate obstructive disease","Spirometry consistent with severe obstructive disease","Spirometry consistent with possible restrictive disease","Spirometry consistent with mixed obstructive and restrictive disease","Spirometry uninterpretable due to technique","Spirometry consistent with normal pattern","No overt abnormalities noted given today's efforts"}.  Please see scanned spirometry results for details.  Skin Testing: {Blank single:19197::"Select foods","Environmental allergy panel","Environmental allergy panel and select foods","Food allergy panel","None","Deferred due to recent antihistamines use"}. *** Results discussed with patient/family.   Medication List:  Current Outpatient Medications  Medication Sig Dispense Refill  . acetaminophen  (TYLENOL ) 325 MG tablet Tylenol  325 mg tablet  Take 2 tablets by oral  route.     . ALPRAZolam  (XANAX ) 0.25 MG tablet TAKE 1 TABLET BY MOUTH 2 TIMES DAILY AS NEEDED FOR ANXIETY. 10 tablet 0  . benzonatate  (TESSALON ) 200 MG capsule Take 200 mg by mouth 3 (three) times daily as needed for cough.    . EPINEPHrine  0.3 mg/0.3 mL IJ SOAJ injection Inject 0.3 mg into the muscle as needed for anaphylaxis. (Patient not taking: Reported on 09/14/2023) 1 each 1  . HYDROCORTISONE  ACE, RECTAL, 30 MG SUPP Place 1 suppository (30 mg total) rectally at bedtime as needed. 28 each 1  . Misc Natural Products (NEURIVA PO) Take 1 tablet by mouth daily.    . ondansetron  (ZOFRAN  ODT) 4 MG disintegrating tablet Take 1 tablet (4 mg total) by mouth every 8 (eight) hours as needed for nausea or vomiting. 20 tablet 5  . Rimegepant Sulfate (NURTEC) 75 MG TBDP TAKE 1 TABLET BY MOUTH AT THE EARLIEST ONSET OF MIGRAINE. MAX 1 TAB/24 HOURS. 8 tablet 5  . SUMAtriptan  (IMITREX ) 100 MG tablet Take 1 tablet (100 mg total) by mouth daily as needed for migraine. May repeat dose in 2 hours if no relief.  Do not exceed 2 doses in 24 hours. (Patient not taking: Reported on 09/14/2023) 10 tablet 2  . triamcinolone cream (KENALOG) 0.1 % Apply topically 2 (two) times daily.    Aaron Aas triamterene-hydrochlorothiazide (DYAZIDE) 37.5-25 MG capsule Take 1 capsule by mouth daily as needed. Reported on 02/03/2016  1  . UNABLE TO FIND Med Name: CBD for Knee Pain    . Vitamin D , Ergocalciferol , (DRISDOL ) 1.25 MG (50000 UNIT) CAPS capsule Take 1 capsule (50,000 Units total) by mouth every 7 (seven) days. 4 capsule 4   No current facility-administered medications for this visit.   Allergies: Allergies  Allergen Reactions  . Other Other (See Comments)  . Latex Itching    Bandaids   I reviewed her past medical history, social history, family history, and environmental history and no significant changes have been reported from her previous visit.  Review of Systems  Constitutional:  Negative for appetite change, chills, fever and  unexpected weight change.  HENT:  Negative for congestion and rhinorrhea.   Eyes:  Negative for itching.  Respiratory:  Negative for cough, chest tightness, shortness of breath and wheezing.   Cardiovascular:  Negative for chest pain.  Gastrointestinal:  Negative for abdominal pain.  Genitourinary:  Negative for difficulty urinating.  Skin:  Negative for rash.  Neurological:  Positive for headaches.   Objective: LMP 12/14/2016 Comment: Priior the Cycle on 12/14/2016, Patient states that she hadn't had a cycle in at least six months. There is no height or weight on file to calculate BMI. Physical Exam Vitals and nursing note reviewed.  Constitutional:      Appearance: Normal appearance. She is well-developed.  HENT:     Head: Normocephalic and atraumatic.     Right Ear: Tympanic membrane and external ear normal.     Left Ear: Tympanic membrane and external ear normal.     Nose: Nose normal.     Mouth/Throat:     Mouth: Mucous membranes are moist.     Pharynx: Oropharynx is clear.  Eyes:     Conjunctiva/sclera: Conjunctivae normal.  Cardiovascular:     Rate and Rhythm: Normal rate and regular rhythm.     Heart sounds: Normal heart sounds. No murmur heard.    No friction rub. No gallop.  Pulmonary:     Effort: Pulmonary effort is normal.  Breath sounds: Normal breath sounds. No wheezing, rhonchi or rales.  Musculoskeletal:     Cervical back: Neck supple.  Skin:    General: Skin is warm.     Findings: No rash.  Neurological:     Mental Status: She is alert and oriented to person, place, and time.  Psychiatric:        Behavior: Behavior normal.  Previous notes and tests were reviewed. The plan was reviewed with the patient/family, and all questions/concerned were addressed.  It was my pleasure to see Nicole Lynch today and participate in her care. Please feel free to contact me with any questions or concerns.  Sincerely,  Eudelia Hero, DO Allergy & Immunology  [facility]  [place] office: [phone] [facility] office: [phone]

## 2024-03-16 ENCOUNTER — Ambulatory Visit (INDEPENDENT_AMBULATORY_CARE_PROVIDER_SITE_OTHER): Payer: Managed Care, Other (non HMO) | Admitting: Allergy

## 2024-03-16 ENCOUNTER — Other Ambulatory Visit: Payer: Self-pay

## 2024-03-16 ENCOUNTER — Encounter: Payer: Self-pay | Admitting: Allergy

## 2024-03-16 VITALS — BP 124/82 | HR 76 | Temp 97.6°F | Wt 128.5 lb

## 2024-03-16 DIAGNOSIS — T63481D Toxic effect of venom of other arthropod, accidental (unintentional), subsequent encounter: Secondary | ICD-10-CM | POA: Diagnosis not present

## 2024-03-16 DIAGNOSIS — Z91018 Allergy to other foods: Secondary | ICD-10-CM | POA: Diagnosis not present

## 2024-03-16 DIAGNOSIS — T63481A Toxic effect of venom of other arthropod, accidental (unintentional), initial encounter: Secondary | ICD-10-CM

## 2024-03-16 NOTE — Patient Instructions (Addendum)
 Alpha gal allergy Continue strict avoidance of all mammalian meat. Try to avoid tick bites.  For mild symptoms you can take over the counter antihistamines and monitor symptoms closely.  Zyrtec 10mg .  If symptoms worsen or if you have severe symptoms including breathing issues, throat closure, significant swelling, whole body hives, severe diarrhea and vomiting, lightheadedness then use epinephrine  and seek immediate medical care afterwards. Emergency action plan in place.  Get bloodwork. If alpha gal level looks favorable will schedule for in office food challenge next.   Localized reaction to bee stings Continue to avoid.  Get bloodwork We are ordering labs, so please allow 1-2 weeks for the results to come back. With the newly implemented Cures Act, the labs might be visible to you at the same time that they become visible to me. However, I will not address the results until all of the results are back, so please be patient.  In the meantime, continue recommendations in your patient instructions, including avoidance measures (if applicable), until you hear from me.  Coughing May use albuterol  nebulizer every 4 to 6 hours as needed for shortness of breath, chest tightness, coughing, and wheezing.  Monitor frequency of use - if you need to use it more than twice per week on a consistent basis let us  know.   Follow up in 12 months depending on bloodwork results.

## 2024-03-31 ENCOUNTER — Ambulatory Visit: Payer: Self-pay | Admitting: Allergy

## 2024-03-31 ENCOUNTER — Telehealth: Payer: Self-pay | Admitting: Allergy

## 2024-03-31 DIAGNOSIS — R748 Abnormal levels of other serum enzymes: Secondary | ICD-10-CM

## 2024-03-31 NOTE — Telephone Encounter (Signed)
 Please mail repeat tryptase bloodwork order to patient's home with [facility] map.  Thank you.

## 2024-03-31 NOTE — Telephone Encounter (Signed)
 I called the patient and went over lab results. Patient will get the repeated blood work in 1-2 months per Dr. Marlis Simper request. Patient confirmed home address for the requisition to be mailed out with [facility] information.

## 2024-06-30 ENCOUNTER — Encounter (HOSPITAL_BASED_OUTPATIENT_CLINIC_OR_DEPARTMENT_OTHER): Payer: Self-pay

## 2024-06-30 ENCOUNTER — Observation Stay (HOSPITAL_BASED_OUTPATIENT_CLINIC_OR_DEPARTMENT_OTHER)
Admission: EM | Admit: 2024-06-30 | Discharge: 2024-07-01 | Disposition: A | Discharge status: Home / Self Care | Attending: Internal Medicine | Admitting: Internal Medicine

## 2024-06-30 ENCOUNTER — Other Ambulatory Visit (HOSPITAL_BASED_OUTPATIENT_CLINIC_OR_DEPARTMENT_OTHER): Payer: Self-pay

## 2024-06-30 ENCOUNTER — Emergency Department (HOSPITAL_BASED_OUTPATIENT_CLINIC_OR_DEPARTMENT_OTHER)

## 2024-06-30 ENCOUNTER — Other Ambulatory Visit: Payer: Self-pay

## 2024-06-30 DIAGNOSIS — E876 Hypokalemia: Secondary | ICD-10-CM | POA: Insufficient documentation

## 2024-06-30 DIAGNOSIS — Z743 Need for continuous supervision: Secondary | ICD-10-CM | POA: Diagnosis not present

## 2024-06-30 DIAGNOSIS — G43101 Migraine with aura, not intractable, with status migrainosus: Secondary | ICD-10-CM | POA: Diagnosis present

## 2024-06-30 DIAGNOSIS — Z91040 Latex allergy status: Secondary | ICD-10-CM | POA: Insufficient documentation

## 2024-06-30 DIAGNOSIS — G4489 Other headache syndrome: Secondary | ICD-10-CM | POA: Diagnosis not present

## 2024-06-30 DIAGNOSIS — G43111 Migraine with aura, intractable, with status migrainosus: Secondary | ICD-10-CM | POA: Diagnosis not present

## 2024-06-30 DIAGNOSIS — R55 Syncope and collapse: Principal | ICD-10-CM | POA: Insufficient documentation

## 2024-06-30 DIAGNOSIS — F32A Depression, unspecified: Secondary | ICD-10-CM | POA: Diagnosis not present

## 2024-06-30 DIAGNOSIS — G43109 Migraine with aura, not intractable, without status migrainosus: Principal | ICD-10-CM

## 2024-06-30 DIAGNOSIS — T7840XA Allergy, unspecified, initial encounter: Secondary | ICD-10-CM | POA: Diagnosis not present

## 2024-06-30 DIAGNOSIS — R42 Dizziness and giddiness: Secondary | ICD-10-CM | POA: Diagnosis not present

## 2024-06-30 DIAGNOSIS — Z883 Allergy status to other anti-infective agents status: Secondary | ICD-10-CM | POA: Diagnosis not present

## 2024-06-30 DIAGNOSIS — F419 Anxiety disorder, unspecified: Secondary | ICD-10-CM | POA: Diagnosis not present

## 2024-06-30 DIAGNOSIS — Z91018 Allergy to other foods: Secondary | ICD-10-CM

## 2024-06-30 DIAGNOSIS — G43919 Migraine, unspecified, intractable, without status migrainosus: Secondary | ICD-10-CM | POA: Insufficient documentation

## 2024-06-30 DIAGNOSIS — Z7982 Long term (current) use of aspirin: Secondary | ICD-10-CM | POA: Insufficient documentation

## 2024-06-30 LAB — COMPREHENSIVE METABOLIC PANEL WITH GFR
ALT: 12 U/L (ref 0–44)
AST: 20 U/L (ref 15–41)
Albumin: 4.8 g/dL (ref 3.5–5.0)
Alkaline Phosphatase: 80 U/L (ref 38–126)
Anion gap: 17 — ABNORMAL HIGH (ref 5–15)
BUN: 15 mg/dL (ref 6–20)
CO2: 21 mmol/L — ABNORMAL LOW (ref 22–32)
Calcium: 10.2 mg/dL (ref 8.9–10.3)
Chloride: 100 mmol/L (ref 98–111)
Creatinine, Ser: 0.97 mg/dL (ref 0.44–1.00)
GFR, Estimated: >60 mL/min (ref >60)
Glucose, Bld: 126 mg/dL — ABNORMAL HIGH (ref 70–99)
Potassium: 3.1 mmol/L — ABNORMAL LOW (ref 3.5–5.1)
Sodium: 139 mmol/L (ref 135–145)
Total Bilirubin: 0.8 mg/dL (ref 0.0–1.2)
Total Protein: 7.5 g/dL (ref 6.5–8.1)

## 2024-06-30 LAB — URINE DRUG SCREEN
Amphetamines: NEGATIVE
Barbiturates: NEGATIVE
Benzodiazepines: NEGATIVE
Cocaine: NEGATIVE
Fentanyl: NEGATIVE
Methadone Scn, Ur: NEGATIVE
Opiates: NEGATIVE
Tetrahydrocannabinol: NEGATIVE

## 2024-06-30 LAB — APTT: aPTT: 25 s (ref 24–36)

## 2024-06-30 LAB — DIFFERENTIAL
Abs Immature Granulocytes: 0.01 K/uL (ref 0.00–0.07)
Basophils Absolute: 0 K/uL (ref 0.0–0.1)
Basophils Relative: 0 %
Eosinophils Absolute: 0.2 K/uL (ref 0.0–0.5)
Eosinophils Relative: 2 %
Immature Granulocytes: 0 %
Lymphocytes Relative: 40 %
Lymphs Abs: 2.6 K/uL (ref 0.7–4.0)
Monocytes Absolute: 0.4 K/uL (ref 0.1–1.0)
Monocytes Relative: 6 %
Neutro Abs: 3.5 K/uL (ref 1.7–7.7)
Neutrophils Relative %: 52 %

## 2024-06-30 LAB — CBC
HCT: 42.8 % (ref 36.0–46.0)
Hemoglobin: 14.8 g/dL (ref 12.0–15.0)
MCH: 32.7 pg (ref 26.0–34.0)
MCHC: 34.6 g/dL (ref 30.0–36.0)
MCV: 94.7 fL (ref 80.0–100.0)
Platelets: 246 K/uL (ref 150–400)
RBC: 4.52 MIL/uL (ref 3.87–5.11)
RDW: 11.8 % (ref 11.5–15.5)
WBC: 6.7 K/uL (ref 4.0–10.5)
nRBC: 0 % (ref 0.0–0.2)

## 2024-06-30 LAB — ETHANOL: Alcohol, Ethyl (B): <15 mg/dL (ref <15)

## 2024-06-30 LAB — PROTIME-INR
INR: 0.9 (ref 0.8–1.2)
Prothrombin Time: 12.6 s (ref 11.4–15.2)

## 2024-06-30 LAB — CBG MONITORING, ED: Glucose-Capillary: 127 mg/dL — ABNORMAL HIGH (ref 70–99)

## 2024-06-30 MED ORDER — MAGNESIUM SULFATE 2 GM/50ML IV SOLN: 2.0000 g | Freq: Once | INTRAVENOUS | Status: DC

## 2024-06-30 MED ORDER — FAMOTIDINE IN NACL 20-0.9 MG/50ML-% IV SOLN
20.0000 mg | Freq: Once | INTRAVENOUS | Status: AC
Start: 2024-06-30 — End: 2024-06-30
  Administered 2024-06-30: 20 mg via INTRAVENOUS
  Filled 2024-06-30: qty 50

## 2024-06-30 MED ORDER — SENNOSIDES-DOCUSATE SODIUM 8.6-50 MG PO TABS: 1.0000 | ORAL_TABLET | Freq: Every evening | ORAL | Status: DC | PRN

## 2024-06-30 MED ORDER — BISACODYL 5 MG PO TBEC: 5.0000 mg | DELAYED_RELEASE_TABLET | Freq: Every day | ORAL | Status: DC | PRN

## 2024-06-30 MED ORDER — SODIUM CHLORIDE 0.9 % IV SOLN: INTRAVENOUS | Status: AC

## 2024-06-30 MED ORDER — VALPROATE SODIUM 100 MG/ML IV SOLN
500.0000 mg | Freq: Once | INTRAVENOUS | Status: AC
  Administered 2024-06-30: 500 mg via INTRAVENOUS
  Filled 2024-06-30: qty 5

## 2024-06-30 MED ORDER — ACETAMINOPHEN 500 MG PO TABS
1000.0000 mg | ORAL_TABLET | Freq: Once | ORAL | Status: AC
  Administered 2024-06-30: 1000 mg via ORAL
  Filled 2024-06-30: qty 2

## 2024-06-30 MED ORDER — ACETAMINOPHEN 500 MG PO TABS
1000.0000 mg | ORAL_TABLET | Freq: Once | ORAL | Status: DC
Start: 2024-06-30 — End: 2024-06-30

## 2024-06-30 MED ORDER — ACETAMINOPHEN 650 MG RE SUPP: 650.0000 mg | Freq: Four times a day (QID) | RECTAL | Status: DC | PRN

## 2024-06-30 MED ORDER — ONDANSETRON HCL 4 MG/2ML IJ SOLN: 4.0000 mg | Freq: Four times a day (QID) | INTRAMUSCULAR | Status: DC | PRN

## 2024-06-30 MED ORDER — MAGNESIUM SULFATE 50 % IJ SOLN
2.0000 g | Freq: Once | INTRAMUSCULAR | Status: DC
Start: 2024-06-30 — End: 2024-06-30
  Filled 2024-06-30: qty 4

## 2024-06-30 MED ORDER — DEXAMETHASONE SODIUM PHOSPHATE 4 MG/ML IJ SOLN
4.0000 mg | Freq: Once | INTRAMUSCULAR | Status: AC
  Administered 2024-06-30: 4 mg via INTRAVENOUS
  Filled 2024-06-30: qty 1

## 2024-06-30 MED ORDER — ACETAMINOPHEN 325 MG PO TABS: 650.0000 mg | ORAL_TABLET | Freq: Four times a day (QID) | ORAL | Status: DC | PRN

## 2024-06-30 MED ORDER — MAGNESIUM SULFATE 2 GM/50ML IV SOLN
2.0000 g | Freq: Once | INTRAVENOUS | Status: AC
  Administered 2024-06-30: 2 g via INTRAVENOUS

## 2024-06-30 MED ORDER — RIVAROXABAN 10 MG PO TABS
10.0000 mg | ORAL_TABLET | Freq: Every day | ORAL | Status: DC
  Filled 2024-06-30: qty 1

## 2024-06-30 MED ORDER — IOHEXOL 350 MG/ML SOLN
75.0000 mL | Freq: Once | INTRAVENOUS | Status: AC | PRN
  Administered 2024-06-30: 75 mL via INTRAVENOUS

## 2024-06-30 MED ORDER — POTASSIUM CHLORIDE CRYS ER 20 MEQ PO TBCR
40.0000 meq | EXTENDED_RELEASE_TABLET | Freq: Once | ORAL | Status: AC
  Administered 2024-06-30: 40 meq via ORAL
  Filled 2024-06-30: qty 2

## 2024-06-30 MED ORDER — ONDANSETRON HCL 4 MG PO TABS: 4.0000 mg | ORAL_TABLET | Freq: Four times a day (QID) | ORAL | Status: DC | PRN

## 2024-06-30 MED ORDER — GADOBUTROL 1 MMOL/ML IV SOLN
6.0000 mL | Freq: Once | INTRAVENOUS | Status: AC | PRN
  Administered 2024-06-30: 6 mL via INTRAVENOUS

## 2024-06-30 MED ORDER — DEXAMETHASONE SODIUM PHOSPHATE 10 MG/ML IJ SOLN
6.0000 mg | Freq: Once | INTRAMUSCULAR | Status: AC
  Administered 2024-06-30: 6 mg via INTRAVENOUS
  Filled 2024-06-30: qty 1

## 2024-06-30 MED ORDER — PROCHLORPERAZINE EDISYLATE 10 MG/2ML IJ SOLN
10.0000 mg | Freq: Once | INTRAMUSCULAR | Status: AC
  Administered 2024-06-30: 10 mg via INTRAVENOUS
  Filled 2024-06-30: qty 2

## 2024-06-30 MED ORDER — LACTATED RINGERS IV BOLUS
1000.0000 mL | Freq: Once | INTRAVENOUS | Status: AC
  Administered 2024-06-30: 1000 mL via INTRAVENOUS

## 2024-06-30 MED ORDER — DIPHENHYDRAMINE HCL 50 MG/ML IJ SOLN
25.0000 mg | Freq: Once | INTRAMUSCULAR | Status: AC
  Administered 2024-06-30: 25 mg via INTRAVENOUS
  Filled 2024-06-30: qty 1

## 2024-06-30 MED ORDER — PROCHLORPERAZINE EDISYLATE 10 MG/2ML IJ SOLN
10.0000 mg | Freq: Once | INTRAMUSCULAR | Status: DC
Start: 2024-06-30 — End: 2024-06-30

## 2024-06-30 NOTE — ED Provider Notes (Signed)
 [place] EMERGENCY DEPARTMENT AT [facility] Provider Note   CSN: [id number] Arrival date & time: 06/30/24  0346     Patient presents with: Code Stroke   Nicole Lynch is a 58 y.o. female.   Level 5 caveat for acuity of condition.  Patient here with husband.  She has a history of migraines as well as alpha gal allergy.  She went to bed feeling normal but woke up about 2:30 AM with severe headache typical of her migraines but more diffuse.  She took her Nurtec about 245 without relief.  She thought the headache could be from an alpha gal exacerbation so she gave herself EpiPen  at 3:15 AM.  She did have takeout last night and and is concerned that something could have been contaminated.  Denies any known meat ingestion.  Denies any chest pain, trouble breathing, tongue swelling, lip swelling, rash.  Does have nausea but no vomiting.  She normally has nausea with her migraine headaches but sometimes can have drooping of her left eye as well.  Denies any focal weakness but feels weak all over and cannot stand and cannot walk.  Husband at bedside reports several syncopal episodes on the way here requiring stimulation to stay awake. They deny any chest pain or shortness of breath.  She is reports her headache is more severe and global than her usual migraines and was suddenly onset.  She was weak all over and cannot stand and cannot walk.  Difficulty staying awake in triage.  No history of diabetes.  Headache is usually to her left side of her head but today is global and diffuse.  She is never having difficulty staying awake before syncopal episodes.  The history is provided by the patient and the spouse.       Prior to Admission medications   Medication Sig Start Date End Date Taking? Authorizing Provider  acetaminophen  (TYLENOL ) 325 MG tablet Tylenol  325 mg tablet  Take 2 tablets by oral route.    [provider]  EPINEPHrine  0.3 mg/0.3 mL IJ SOAJ injection Inject 0.3 mg  into the muscle as needed for anaphylaxis. 09/02/23   Almarie Waddell NOVAK, NP  Misc Natural Products (NEURIVA PO) Take 1 tablet by mouth daily.    [provider]  ondansetron  (ZOFRAN  ODT) 4 MG disintegrating tablet Take 1 tablet (4 mg total) by mouth every 8 (eight) hours as needed for nausea or vomiting. 02/24/21   Skeet, Adam R, DO  Rimegepant Sulfate (NURTEC) 75 MG TBDP TAKE 1 TABLET BY MOUTH AT THE EARLIEST ONSET OF MIGRAINE. MAX 1 TAB/24 HOURS. 04/29/23   Domenica Harlene LABOR, MD  UNABLE TO FIND Med Name: CBD for Knee Pain    [provider]    Allergies: Alpha-gal, Other, and Latex    Review of Systems  Constitutional:  Negative for activity change, appetite change and fever.  HENT:  Negative for congestion.   Respiratory:  Negative for chest tightness and shortness of breath.   Cardiovascular:  Negative for chest pain.  Gastrointestinal:  Positive for nausea. Negative for abdominal pain and vomiting.  Genitourinary:  Negative for dysuria and hematuria.  Musculoskeletal:  Negative for arthralgias and myalgias.  Skin:  Negative for rash.  Neurological:  Positive for facial asymmetry, speech difficulty, weakness, light-headedness and headaches.   all other systems are negative except as noted in the HPI and PMH.    Updated Vital Signs BP (!) 183/106 (BP Location: Right Arm)   Pulse 91  Temp 98.7 F (37.1 C) (Oral)   Resp 17   LMP 12/14/2016 Comment: Priior the Cycle on 12/14/2016, Patient states that she hadn't had a cycle in at least six months.  SpO2 100%   Physical Exam Vitals and nursing note reviewed.  Constitutional:      General: She is not in acute distress.    Appearance: She is well-developed. She is ill-appearing.     Comments: Requiring stimulation to stay awake.  Answering questions appropriately.  HENT:     Head: Normocephalic and atraumatic.     Mouth/Throat:     Pharynx: No oropharyngeal exudate.  Eyes:     Conjunctiva/sclera: Conjunctivae normal.      Pupils: Pupils are equal, round, and reactive to light.  Neck:     Comments: No meningismus. Cardiovascular:     Rate and Rhythm: Normal rate and regular rhythm.     Heart sounds: Normal heart sounds. No murmur heard. Pulmonary:     Effort: Pulmonary effort is normal. No respiratory distress.     Breath sounds: Normal breath sounds.  Chest:     Chest wall: No tenderness.  Abdominal:     Palpations: Abdomen is soft.     Tenderness: There is no abdominal tenderness. There is no guarding or rebound.  Musculoskeletal:        General: No tenderness. Normal range of motion.     Cervical back: Normal range of motion and neck supple.  Skin:    General: Skin is warm.  Neurological:     Mental Status: She is alert and oriented to person, place, and time.     Cranial Nerves: Cranial nerve deficit present.     Motor: No abnormal muscle tone.     Coordination: Coordination normal.     Comments: Subtle left-sided facial droop.  Tongue is midline.  Able to raise arms and legs off the bed equally.  Equal grip strength bilaterally.  Psychiatric:        Behavior: Behavior normal.     (all labs ordered are listed, but only abnormal results are displayed) Labs Reviewed  COMPREHENSIVE METABOLIC PANEL WITH GFR - Abnormal; Notable for the following components:      Result Value   Potassium 3.1 (*)    CO2 21 (*)    Glucose, Bld 126 (*)    Anion gap 17 (*)    All other components within normal limits  CBG MONITORING, ED - Abnormal; Notable for the following components:   Glucose-Capillary 127 (*)    All other components within normal limits  ETHANOL  PROTIME-INR  APTT  CBC  DIFFERENTIAL  URINE DRUG SCREEN    EKG: EKG Interpretation Date/Time:  Friday June 30 2024 04:43:17 EDT Ventricular Rate:  83 PR Interval:  202 QRS Duration:  95 QT Interval:  407 QTC Calculation: 479 R Axis:   57  Text Interpretation: Sinus rhythm Borderline prolonged PR interval No significant change  was found Confirmed by Carita Senior [phone]) on 06/30/2024 5:18:01 AM  Radiology: CT HEAD CODE STROKE WO CONTRAST Addendum Date: 06/30/2024 ADDENDUM REPORT: 06/30/2024 04:56 ADDENDUM: Study discussed by telephone with Dr. SENIOR CARITA on 06/30/2024 at 04:55 . Electronically Signed   By: VEAR Hurst M.D.   On: 06/30/2024 04:56   Result Date: 06/30/2024 CLINICAL DATA:  Code stroke.  58 year old female. EXAM: CT HEAD WITHOUT CONTRAST TECHNIQUE: Contiguous axial images were obtained from the base of the skull through the vertex without intravenous contrast. RADIATION DOSE REDUCTION: This exam  was performed according to the departmental dose-optimization program which includes automated exposure control, adjustment of the mA and/or kV according to patient size and/or use of iterative reconstruction technique. COMPARISON:  Head CT 11/14/2015. FINDINGS: Brain: Normal cerebral volume for age. No midline shift, ventriculomegaly, mass effect, evidence of mass lesion, intracranial hemorrhage or evidence of cortically based acute infarction. Gray-white matter differentiation is within normal limits throughout the brain. Vascular: Faint Calcified atherosclerosis at the skull base. No suspicious intracranial vascular hyperdensity. Skull: Intact. Sinuses/Orbits: Chronic mild maxillary sinus mucosal thickening and retention cysts. Other Visualized paranasal sinuses and mastoids are stable and well aerated. Other: No gaze deviation. No acute orbit or scalp soft tissue finding. ASPECTS [facility] Stroke Program Early CT Score) Total score (0-10 with 10 being normal): 10 IMPRESSION: Normal for age noncontrast CT appearance of the brain.  ASPECTS 10. Electronically Signed: By: VEAR Hurst M.D. On: 06/30/2024 04:44   CT ANGIO HEAD NECK W WO CM (CODE STROKE) Result Date: 06/30/2024 CLINICAL DATA:  Code stroke. 58 year old female. Dr. GARNETTE HALO reports presentation is severe headache and LEFT facial droop. EXAM: CT ANGIOGRAPHY  HEAD AND NECK TECHNIQUE: Multidetector CT imaging of the head and neck was performed using the standard protocol during bolus administration of intravenous contrast. Multiplanar CT image reconstructions and MIPs were obtained to evaluate the vascular anatomy. Carotid stenosis measurements (when applicable) are obtained utilizing NASCET criteria, using the distal internal carotid diameter as the denominator. RADIATION DOSE REDUCTION: This exam was performed according to the departmental dose-optimization program which includes automated exposure control, adjustment of the mA and/or kV according to patient size and/or use of iterative reconstruction technique. CONTRAST:  75mL OMNIPAQUE  IOHEXOL  350 MG/ML SOLN COMPARISON:  Head CT today. FINDINGS: CTA NECK Skeleton: Mandible motion artifact. Mild for age cervical spine degeneration. No acute osseous abnormality identified. Upper chest: Visible subglottic trachea is normal, upper lungs well aerated. Negative visible superior mediastinum. Other neck: Heterogeneous right thyroid  lobe nodule measures 16 mm diameter. Mild motion artifact in the neck. Other nonvascular neck soft tissue spaces appear negative. Aortic arch: Mostly visible 3 vessel arch.  No arch atherosclerosis. Right carotid system: Patent with mild tortuosity, no significant plaque or stenosis. Left carotid system: Patent with no significant plaque or stenosis. Mild tortuosity. Vertebral arteries: Early origin of the right vertebral from the proximal subclavian. Dominant and tortuous right vertebral with a late entry into the cervical transverse foramen (series 7, image 226). No right vertebral artery plaque or stenosis to the skull base. Proximal left subclavian artery and left vertebral artery origin are normal. Non dominant left vertebral artery with a fairly normal caliber is patent to the skull base with mild tortuosity, no significant plaque or stenosis. CTA HEAD Posterior circulation: Diminutive distal  left vertebral V4 segment but remains patent to the basilar. Dominant right vertebral V4 segment primarily supplies the basilar with no significant plaque or stenosis. Normal right PICA origin. Left AICA appears dominant. Patent basilar artery without stenosis. Normal SCA and PCA origins. Fetal type right PCA origin. Left posterior communicating artery diminutive or absent. Bilateral PCA branches are within normal limits. Anterior circulation: Both ICA siphons are patent, mildly ectatic, with no atherosclerosis or stenosis. Normal right posterior communicating artery origin. Patent carotid termini, MCA and ACA origins. Dominant left ACA A1 segment and mildly ectatic anterior communicating artery. Diminutive right A1. No saccular aneurysm. Bilateral ACA branches are tortuous but otherwise within normal limits. Left MCA M1 segment and bifurcation are patent without stenosis. Right MCA  M1 segment bifurcates early without stenosis. Left MCA branches appear patent on series 12, image 30, although with questionable segmental vaso constriction of an M3 branch (arrow). No other convincing MCA or intracranial vaso spasm bilaterally. Venous sinuses: Early contrast timing, superior sagittal sinus appears patent. Anatomic variants: Dominant right vertebral artery with both an early origin from the subclavian and late entry into the cervical transverse foramen. Dominant left and diminutive right ACA A1 segments. Review of the MIP images confirms the above findings IMPRESSION: 1. Negative for large vessel occlusion. Generalized vessel tortuosity but no aneurysm identified. No atherosclerosis. 2. Questionable segmental vasospasm of a Left MCA M3 branch. Other circle-of-Willis branches appear within normal limits. In the appropriate clinical setting RCVS could be considered. 3. Heterogeneous 16 mm right thyroid  lobe nodule meets size criteria for non emergent follow-up thyroid  ultrasound. #1 and #2 were discussed by telephone with  Dr. GARNETTE HALO on 06/30/2024 at 04:55 . Electronically Signed   By: VEAR Hurst M.D.   On: 06/30/2024 04:56     .Critical Care  Performed by: HALO GARNETTE, MD Authorized by: HALO GARNETTE, MD   Critical care provider statement:    Critical care time (minutes):  45   Critical care time was exclusive of:  Separately billable procedures and treating other patients   Critical care was necessary to treat or prevent imminent or life-threatening deterioration of the following conditions:  CNS failure or compromise   Critical care was time spent personally by me on the following activities:  Development of treatment plan with patient or surrogate, discussions with consultants, evaluation of patient's response to treatment, examination of patient, ordering and review of laboratory studies, ordering and review of radiographic studies, ordering and performing treatments and interventions, pulse oximetry, re-evaluation of patient's condition, review of old charts, blood draw for specimens and obtaining history from patient or surrogate   I assumed direction of critical care for this patient from another provider in my specialty: no     Care discussed with: admitting provider      Medications Ordered in the ED  dexamethasone  (DECADRON ) injection 4 mg (has no administration in time range)  diphenhydrAMINE  (BENADRYL ) injection 25 mg (has no administration in time range)  famotidine  (PEPCID ) IVPB 20 mg premix (has no administration in time range)  lactated ringers  bolus 1,000 mL (has no administration in time range)                                    Medical Decision Making Amount and/or Complexity of Data Reviewed Independent Historian: spouse Labs: ordered. Decision-making details documented in ED Course. Radiology: ordered and independent interpretation performed. Decision-making details documented in ED Course. ECG/medicine tests: ordered and independent interpretation performed.  Decision-making details documented in ED Course.  Risk OTC drugs. Prescription drug management. Decision regarding hospitalization.   Patient went to bed normally but woke up at 2:30 AM with severe migraine headache.  Gave herself EpiPen  as she thought this could be alpha gal allergy but that has not have any chest pain or shortness of breath.  On arrival she has left-sided facial droop and difficulty staying awake.  Code stroke was activated.  EKG is sinus rhythm.  No Brugada, no prolonged QT.  CT head negative for hemorrhage.  Discussed with Dr. Hurst of radiology.  CTA negative for aneurysm or large vessel occlusion but does show possible vasospasm of the left MCA branch.  Consider RCVS but this is on the contralateral side of her symptoms.  Seen by teleneurology Dr. Zamini. Favors complex migraine versus medication overuse headache versus allergic reaction.  Recommends migraine treatment with magnesium , Compazine , Benadryl , Tylenol , Decadron . TNK not recommended  Notably reports her mother died 36 hours ago and she is due to make arrangements for her this morning.  Symptoms improving.  Patient becoming more alert.  Facial droop is improving without any other neurological deficits.  Discussed with neurology as above as well as Dr. Merrianne of neurology at [facility].  He does recommend MRI with and without contrast.  Headache has improved with Toradol , Reglan , Decadron , magnesium , Compazine .  TNK not recommended by neurology.  Deficits are improving.  Complicated migraine is favored.  Patient agreeable to admission for MRI given her ongoing left-sided facial droop and headache per neurology recommendations.  Would also benefit from syncope workup given her recurrent syncopal episodes.  No arrhythmias seen throughout ED course.  No Brugada, no prolonged QT.  Denies chest pain or shortness of breath.  Admission for MRI and further evaluation of recurrent syncope discussed with Dr. Sundil.      Final diagnoses:  Complicated migraine  Recurrent syncope    ED Discharge Orders     None          Ivanka Kirshner, Garnette, MD 06/30/24 [phone]

## 2024-06-30 NOTE — Consult Note (Signed)
 [facility]   Patient Name:   Nicole Lynch, Nicole Lynch Date of Birth:   06-20-1966 Identification Number:   MRN - [id number] Date of Service:   06/30/2024 04:21:55  Diagnosis:       R51.9 - Headache, unspecified  Impression:      Severe Migraine with Atypical Features:    Complex Migraine v Medication Overuse Headache v Allergic Reaction     Administer IV medications:   Magnesium  2 grams   Compazine  10 mg   Benadryl  25 mg   Tylenol  1 gram   Decadron    Pepcid    Administer 500cc IV fluids   Dim lights to reduce photophobia   - If the headache does not resolve after the above, then provide 500 mg IV divalproex sodium once.   - If the headache does not resolve after the above, then provide 100mg  IV lacosamide once.   - If his headache can not be controlled to a tolerable range with the above, admit for MRI brain with and without contrast.    Recommend Mg glycinate 400mg  Qbed and Riboflavin (B2) 400mg  Qbed for migraine ppx.  For abortive therapy, Naproxen  <3x weekly to avoid rebound/medication overuse phenomena.     - Consider referral to a neurologist for further evaluation and management of migraines, including preventive medication options: SSRI, TC, CGRP inhibitors     Monitor for symptom improvement and vital sign stabilization   Reassess neurological status periodically   Educate patient on the importance of preventive measures for migraines    Possible Allergic Reaction:   Continue monitoring for signs of allergic reaction   Administered Benadryl  25 mg IV as part of the treatment regimen   Consider allergy/immunology consultation if symptoms persist or recur    Syncope:   Monitor vital signs closely   Maintain IV access and continue fluid administration   Consider additional cardiac workup if syncope recurs or if other concerning symptoms develop  Our recommendations are outlined below.  Recommendations:        IV Fluids,  Normal Saline       Head of Bed 30 Degrees       Euglycemia and Avoid Hyperthermia (PRN Acetaminophen )  Sign Out:       Discussed with Rapid Response Team    ------------------------------------------------------------------------------  Advanced Imaging: CTA Head and Neck Completed.  LVO:No  Patient is not a candidate for NIR   Metrics: Last Known Well: 06/29/2024 23:00:00 Dispatch Time: 06/30/2024 04:21:55 Arrival Time: 06/30/2024 03:46:00 Initial Response Time: 06/30/2024 04:22:35 Symptoms: headache and syncope. Initial patient interaction: 06/30/2024 04:34:45 NIHSS Assessment Completed: 06/30/2024 04:37:13 Patient is not a candidate for Thrombolytic. Thrombolytic Medical Decision: 06/30/2024 05:07:25 Patient was not deemed candidate for Thrombolytic because of following reasons: LKW outside 4.5 hr window. .  CT Head: I personally reviewed all the CT images that were available to me and it showed: no acute findings  ED Physician not notified of diagnostic impression and management plan because Unable to reach provider after multiple attempts    ------------------------------------------------------------------------------  History of Present Illness: Patient is a 58 year old Female.  Patient was brought by private transportation with symptoms of headache and syncope. Patient is a female presents with severe migraine with nausea at 2:30 AM, two syncopal episodes in the car, and multiple syncopal episodes at the hospital. Nicole Lynch, a patient with a history of migraines and Alpha-Gal, woke up around 2:30 AM with what she described as severe headache which was worse than her usual  migraine symptoms. She took Nurtec at 2:45 AM without improvement. At 3:00 AM, she woke her husband, and by 3:15 AM, they administered an EpiPen  due to concerns about an allergic response. The patient experienced two syncopal episodes in the car on the way to the hospital and several more  after arrival. This is notable as she reports never having passed out from migraines before. Her last known well time was 23:00 the previous night. The patient's current pain level is rated as 8 out of 10, with light and sound sensitivity. She also reports nausea and jitteriness, which may be attributed to the epinephrine  administration.  Upon review of systems, the patient reports jitteriness and left eye drooping during migraines. She describes severe migraine with photophobia and phonophobia. She mentions syncopal episodes and nausea. She denies dizziness.   Past Medical History:      Migraine Headaches Other PMH:  alpha-gal  Medications:  No Anticoagulant use  No Antiplatelet use Reviewed EMR for current medications  Allergies:  Reviewed  Social History: Drug Use: No  Family History:  There is no family history of premature cerebrovascular disease pertinent to this consultation  ROS : 14 Points Review of Systems was performed and was negative except mentioned in HPI.  Past Surgical History: There Is No Surgical History Contributory To Today's Visit    Examination: BP(158/111), Pulse(78), 1A: Level of Consciousness - Alert; keenly responsive + 0 1B: Ask Month and Age - Both Questions Right + 0 1C: Blink Eyes & Squeeze Hands - Performs Both Tasks + 0 2: Test Horizontal Extraocular Movements - Normal + 0 3: Test Visual Fields - No Visual Loss + 0 4: Test Facial Palsy (Use Grimace if Obtunded) - Normal symmetry + 0 5A: Test Left Arm Motor Drift - No Drift for 10 Seconds + 0 5B: Test Right Arm Motor Drift - No Drift for 10 Seconds + 0 6A: Test Left Leg Motor Drift - No Drift for 5 Seconds + 0 6B: Test Right Leg Motor Drift - No Drift for 5 Seconds + 0 7: Test Limb Ataxia (FNF/Heel-Shin) - No Ataxia + 0 8: Test Sensation - Normal; No sensory loss + 0 9: Test Language/Aphasia - Normal; No aphasia + 0 10: Test Dysarthria - Normal + 0 11: Test Extinction/Inattention - No  abnormality + 0  NIHSS Score: 0   Pre-Morbid Modified Rankin Scale: 0 Points = No symptoms at all   This consult was conducted in real time using interactive audio and Immunologist. Patient was informed of the technology being used for this visit and agreed to proceed. Patient located in hospital and provider located at home/office setting.   Patient is being evaluated for possible acute neurologic impairment and high probability of imminent or life-threatening deterioration. I spent total of 45 minutes providing care to this patient, including time for face to face visit via telemedicine, review of medical records, imaging studies and discussion of findings with providers, the patient and/or family.    Dr Khadim Lundberg   [facility] For Inpatient follow-up with [facility] physician please call [facility] at [phone]. As we are not an outpatient service for any post hospital discharge needs please contact the hospital for assistance. If you have any questions for the [facility] physicians or need to reconsult for clinical or diagnostic changes please contact us  via [facility] at [phone].   Signature : Dionis Autry

## 2024-06-30 NOTE — H&P (Signed)
 History and Physical  Nicole Lynch FMW:[id number] DOB: Dec 24, 1965 DOA: 06/30/2024  PCP: Domenica Harlene LABOR, MD   Chief Complaint: Syncope episodes, code stroke  HPI: Nicole Lynch is a 58 y.o. female with medical history significant for migraines, alpha gal allergy, anxiety and depression who presented to [facility] due to recurrent syncope episodes. Patient reports her mother passed away on 14-Jul-2024 night and while dealing with her death, they ordered some takeout chicken from a [employer] and ate around 8:30 pm Thursday.  She was in her usual state of health until 2:30 AM on Friday when she woke up with significant headache which did not improved after taking her Nurtec.  The headache progressed quickly with associated nausea so she thought this was due to headache from her alpha-gal exacerbation so she took EpiPen  around 3:15 AM.  She continued to have the headache and had 1 episode of emesis.  Per husband, on the way to the ER, patient had 2 episodes of losing consciousness described as  body went limp requiring stimulation to stay awake. While in the ED, patient had 2 additional episodes of brief syncope episodes, again requiring stimulation to wake up. Patient endorsed generalized weakness during these episodes but denied any shortness of breath, focal weakness, lip or tongue swelling, dysarthria, numbness or tingling. Reports improvement in her headache. Per spouse, patient's syncope episodes all happened within a 45-minute period the patient has not had any further episodes since then.   ED Course: At presentation to ED patient found hypertensive otherwise hemodynamically stable. Labs significant for CMP w/ low potassium 3.1 otherwise unremarkable, CBC unremarkable, Normal APTT INR.  Normal UDS and blood alcohol level. EKG shows normal sinus rhythm.  Code stroke was called on arrival to the ED and urgent CT head was negative. CTA head and neck with no LVO however  it showed  segmental vasospasm  of a Left MCA M3 branch. Other circle-of-Willis branches appear within normal limits. In the appropriate clinical setting RCVS could be considered.  MRI brain with no acute intracranial abnormality.  Pt received Tylenol  1 g, IV Decadron  x2, IV Benadryl , IV Pepcid , IV LR 1 L x 2, IV valproate, IV magnesium  and oral KCl 40 mEq x 1. Patient was admitted to [facility] service and transferred to [facility].  Review of Systems: Please see HPI for pertinent positives and negatives. A complete 10 system review of systems are otherwise negative.  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 She has been exposed to tobacco smoke. She has never used smokeless tobacco. She reports that she does not drink alcohol and does not use drugs.  Allergies  Allergen Reactions   Alpha-Gal     Migraine, nausea   Other Other (See Comments)   Latex Itching    Bandaids    Family History  Problem Relation Age of Onset   Vision loss Mother  macular degeneration   Heart disease Father        afib on blood thinner   Cancer Father        prostate cancer   Urinary tract infection Father    Colon cancer Brother    Colon polyps Neg Hx    Esophageal cancer Neg Hx    Rectal cancer Neg Hx    Stomach cancer Neg Hx    Allergic rhinitis Neg Hx    Angioedema Neg Hx    Asthma Neg Hx    Eczema Neg Hx    Urticaria Neg Hx      Prior to Admission medications   Medication Sig Start Date End Date Taking? Authorizing  Provider  acetaminophen  (TYLENOL ) 325 MG tablet Tylenol  325 mg tablet  Take 2 tablets by oral route.    [provider]  EPINEPHrine  0.3 mg/0.3 mL IJ SOAJ injection Inject 0.3 mg into the muscle as needed for anaphylaxis. 09/02/23   Almarie Waddell NOVAK, NP  Misc Natural Products (NEURIVA PO) Take 1 tablet by mouth daily.    [provider]  ondansetron  (ZOFRAN  ODT) 4 MG disintegrating tablet Take 1 tablet (4 mg total) by mouth every 8 (eight) hours as needed for nausea or vomiting. 02/24/21   Skeet, Adam R, DO  Rimegepant Sulfate (NURTEC) 75 MG TBDP TAKE 1 TABLET BY MOUTH AT THE EARLIEST ONSET OF MIGRAINE. MAX 1 TAB/24 HOURS. 04/29/23   Domenica Harlene LABOR, MD  UNABLE TO FIND Med Name: CBD for Knee Pain    [provider]    Physical Exam: BP (!) 155/82 (BP Location: Left Arm)   Pulse 75   Temp 98.5 F (36.9 C) (Oral)   Resp 15   LMP 12/14/2016 Comment: Priior the Cycle on 12/14/2016, Patient states that she hadn't had a cycle in at least six months.  SpO2 97%  General: Pleasant, well-appearing middle-age woman laying in bed. No acute distress. HEENT: [place]/AT. Anicteric sclera. EOMI. PERRLA. CV: RRR. No murmurs, rubs, or gallops. No LE edema Pulmonary: Lungs CTAB. Normal effort. No wheezing or rales. Abdominal: Soft, nontender, nondistended. Normal bowel sounds. Extremities: Palpable radial and DP pulses. Normal ROM. Skin: Warm and dry. No obvious rash or lesions. Neuro: A&Ox3. Speech is clear and comprehensible. Moves all extremities. Strength 5/5 in all extremities. Normal sensation to light touch. No focal deficit. Psych: Normal mood and affect          Labs on Admission:  Basic Metabolic Panel: Recent Labs  Lab 06/30/24 0419  NA 139  K 3.1*  CL 100  CO2 21*  GLUCOSE 126*  BUN 15  CREATININE 0.97  CALCIUM 10.2   Liver Function Tests: Recent Labs  Lab 06/30/24 0419  AST 20  ALT 12  ALKPHOS 80  BILITOT 0.8  PROT 7.5  ALBUMIN 4.8   No results for  input(s): LIPASE, AMYLASE in the last 168 hours. No results for input(s): AMMONIA in the last 168 hours. CBC: Recent Labs  Lab 06/30/24 0419  WBC 6.7  NEUTROABS 3.5  HGB 14.8  HCT 42.8  MCV 94.7  PLT 246   Cardiac Enzymes: No results for input(s): CKTOTAL, CKMB, CKMBINDEX, TROPONINI in the last 168 hours. BNP (last 3 results) No results for input(s): BNP in the last 8760 hours.  ProBNP (last 3 results) No results for input(s): PROBNP in the last 8760 hours.  CBG: Recent Labs  Lab 06/30/24 0418  GLUCAP 127*    Radiological Exams on Admission: MR Brain  W and Wo Contrast Result Date: 06/30/2024 CLINICAL DATA:  Provided history: Neuro deficit, acute, stroke suspected. EXAM: MRI HEAD WITHOUT AND WITH CONTRAST TECHNIQUE: Multiplanar, multiecho pulse sequences of the brain and surrounding structures were obtained without and with intravenous contrast. CONTRAST:  6mL GADAVIST  GADOBUTROL  1 MMOL/ML IV SOLN COMPARISON:  Non-contrast head CT and CT angiogram head/neck 06/30/2024. FINDINGS: Brain: Cerebral volume is normal. Tiny nonspecific focus of T2 FLAIR hyperintense signal abnormality within the left parietal white matter (series 11, image 31). No cortical encephalomalacia is identified. There is no acute infarct. No evidence of an intracranial mass. No chronic intracranial blood products. No extra-axial fluid collection. No midline shift. No pathologic intracranial enhancement identified. Vascular: Maintained flow voids within the proximal large arterial vessels. Skull and upper cervical spine: No focal worrisome marrow lesion. Sinuses/Orbits: No mass or acute finding within the imaged orbits. 10 mm mucous retention cyst within the right maxillary sinus. IMPRESSION: 1.  No evidence of an acute intracranial abnormality. 2. Tiny nonspecific chronic insult within the left parietal white matter. 3. Otherwise unremarkable MRI appearance of the brain. 4. 10 mm right maxillary sinus  mucous retention cyst. Electronically Signed   By: Rockey Childs D.O.   On: 06/30/2024 09:07   CT HEAD CODE STROKE WO CONTRAST Addendum Date: 06/30/2024 ADDENDUM REPORT: 06/30/2024 04:56 ADDENDUM: Study discussed by telephone with Dr. GARNETTE HALO on 06/30/2024 at 04:55 . Electronically Signed   By: VEAR Hurst M.D.   On: 06/30/2024 04:56   Result Date: 06/30/2024 CLINICAL DATA:  Code stroke.  58 year old female. EXAM: CT HEAD WITHOUT CONTRAST TECHNIQUE: Contiguous axial images were obtained from the base of the skull through the vertex without intravenous contrast. RADIATION DOSE REDUCTION: This exam was performed according to the departmental dose-optimization program which includes automated exposure control, adjustment of the mA and/or kV according to patient size and/or use of iterative reconstruction technique. COMPARISON:  Head CT 11/14/2015. FINDINGS: Brain: Normal cerebral volume for age. No midline shift, ventriculomegaly, mass effect, evidence of mass lesion, intracranial hemorrhage or evidence of cortically based acute infarction. Gray-white matter differentiation is within normal limits throughout the brain. Vascular: Faint Calcified atherosclerosis at the skull base. No suspicious intracranial vascular hyperdensity. Skull: Intact. Sinuses/Orbits: Chronic mild maxillary sinus mucosal thickening and retention cysts. Other Visualized paranasal sinuses and mastoids are stable and well aerated. Other: No gaze deviation. No acute orbit or scalp soft tissue finding. ASPECTS [facility] Stroke Program Early CT Score) Total score (0-10 with 10 being normal): 10 IMPRESSION: Normal for age noncontrast CT appearance of the brain.  ASPECTS 10. Electronically Signed: By: VEAR Hurst M.D. On: 06/30/2024 04:44   CT ANGIO HEAD NECK W WO CM (CODE STROKE) Result Date: 06/30/2024 CLINICAL DATA:  Code stroke. 58 year old female. Dr. GARNETTE HALO reports presentation is severe headache and LEFT facial droop. EXAM: CT  ANGIOGRAPHY HEAD AND NECK TECHNIQUE: Multidetector CT imaging of the head and neck was performed using the standard protocol during bolus administration of intravenous contrast. Multiplanar CT image reconstructions and MIPs were obtained to evaluate the vascular anatomy. Carotid stenosis measurements (when applicable) are obtained utilizing NASCET criteria, using the distal internal carotid diameter as the denominator. RADIATION DOSE REDUCTION: This exam was performed according to the departmental dose-optimization program which includes automated exposure control, adjustment of the mA and/or kV according to patient size and/or use of iterative reconstruction technique. CONTRAST:  75mL OMNIPAQUE  IOHEXOL  350 MG/ML SOLN COMPARISON:  Head CT today. FINDINGS: CTA NECK Skeleton: Mandible motion artifact. Mild  for age cervical spine degeneration. No acute osseous abnormality identified. Upper chest: Visible subglottic trachea is normal, upper lungs well aerated. Negative visible superior mediastinum. Other neck: Heterogeneous right thyroid  lobe nodule measures 16 mm diameter. Mild motion artifact in the neck. Other nonvascular neck soft tissue spaces appear negative. Aortic arch: Mostly visible 3 vessel arch.  No arch atherosclerosis. Right carotid system: Patent with mild tortuosity, no significant plaque or stenosis. Left carotid system: Patent with no significant plaque or stenosis. Mild tortuosity. Vertebral arteries: Early origin of the right vertebral from the proximal subclavian. Dominant and tortuous right vertebral with a late entry into the cervical transverse foramen (series 7, image 226). No right vertebral artery plaque or stenosis to the skull base. Proximal left subclavian artery and left vertebral artery origin are normal. Non dominant left vertebral artery with a fairly normal caliber is patent to the skull base with mild tortuosity, no significant plaque or stenosis. CTA HEAD Posterior circulation:  Diminutive distal left vertebral V4 segment but remains patent to the basilar. Dominant right vertebral V4 segment primarily supplies the basilar with no significant plaque or stenosis. Normal right PICA origin. Left AICA appears dominant. Patent basilar artery without stenosis. Normal SCA and PCA origins. Fetal type right PCA origin. Left posterior communicating artery diminutive or absent. Bilateral PCA branches are within normal limits. Anterior circulation: Both ICA siphons are patent, mildly ectatic, with no atherosclerosis or stenosis. Normal right posterior communicating artery origin. Patent carotid termini, MCA and ACA origins. Dominant left ACA A1 segment and mildly ectatic anterior communicating artery. Diminutive right A1. No saccular aneurysm. Bilateral ACA branches are tortuous but otherwise within normal limits. Left MCA M1 segment and bifurcation are patent without stenosis. Right MCA M1 segment bifurcates early without stenosis. Left MCA branches appear patent on series 12, image 30, although with questionable segmental vaso constriction of an M3 branch (arrow). No other convincing MCA or intracranial vaso spasm bilaterally. Venous sinuses: Early contrast timing, superior sagittal sinus appears patent. Anatomic variants: Dominant right vertebral artery with both an early origin from the subclavian and late entry into the cervical transverse foramen. Dominant left and diminutive right ACA A1 segments. Review of the MIP images confirms the above findings IMPRESSION: 1. Negative for large vessel occlusion. Generalized vessel tortuosity but no aneurysm identified. No atherosclerosis. 2. Questionable segmental vasospasm of a Left MCA M3 branch. Other circle-of-Willis branches appear within normal limits. In the appropriate clinical setting RCVS could be considered. 3. Heterogeneous 16 mm right thyroid  lobe nodule meets size criteria for non emergent follow-up thyroid  ultrasound. #1 and #2 were discussed  by telephone with Dr. GARNETTE HALO on 06/30/2024 at 04:55 . Electronically Signed   By: VEAR Hurst M.D.   On: 06/30/2024 04:56   Assessment/Plan ORTHA METTS is a 58 y.o. female with medical history significant for migraines, alpha gal allergy, anxiety and depression who presented to [facility] due to recurrent syncope episodes.   # Recurrent syncope - Patient presented after multiple brief syncope episodes in the setting of epinephrine  use and possible alpha gal allergic reaction - Syncope episodes brief in nature and without postictal state, making seizure episodes unlikely - Acute stroke ruled out with normal CT head and MRI brain - There is a concern for possible reversible cerebral vasoconstriction syndrome (RCVS) on CTA head and neck - No further syncope episodes in the last 12 hours - No high risk findings on EKG, pending TTE to rule out cardiac etiology - Follow-up TTE  and orthostatic vitals - IV NS 100 cc/h for 10 hours - Telemetry  # Possible allergic reaction # Hx of Alpha gal allergy - Patient with history of alpha gal allergy presenting with recurrent syncope episodes and possible allergic reaction - S/p epinephrine  injection followed by recurrent syncope episodes - Patient with no lip/tongue swelling, rash or shortness of breath however reports only headache and nausea followed by rash with her alpha-gal allergies - Continue close monitoring - IV Benadryl  as needed for allergy  # ?Complex migraine - Patient with history of migraine headache presenting after severe headache followed by nausea and recurrent syncope episodes - Headache improved with migraine cocktail in the ED - Neurology consulted in the ED, unclear if patient's presentation is complex migraine versus medication overuse headache versus allergic reaction - Tylenol  as needed for headache, repeat migraine cocktail as needed  # Hypokalemia - K+ low at 3.1 on admission, repleted with 40 mEq of Kcl -  Follow-up morning K+ and mag  # Anxiety and depression - Reports her mother passed away 2 days prior to her admission - Likely acute stress however unclear how much this is contributing to her presentation - Consult to chaplain for emotional/spiritual support   DVT prophylaxis: SCDs    Code Status: Full Code  Consults called: Neurology  Family Communication: Discussed admission with spouse at bedside  Severity of Illness: The appropriate patient status for this patient is INPATIENT. Inpatient status is judged to be reasonable and necessary in order to provide the required intensity of service to ensure the patient's safety. The patient's presenting symptoms, physical exam findings, and initial radiographic and laboratory data in the context of their chronic comorbidities is felt to place them at high risk for further clinical deterioration. Furthermore, it is not anticipated that the patient will be medically stable for discharge from the hospital within 2 midnights of admission.   * I certify that at the point of admission it is my clinical judgment that the patient will require inpatient hospital care spanning beyond 2 midnights from the point of admission due to high intensity of service, high risk for further deterioration and high frequency of surveillance required.*  Level of care: Progressive   This record has been created using Conservation officer, historic buildings. Errors have been sought and corrected, but may not always be located. Such creation errors do not reflect on the standard of care.   Lou Claretta HERO, MD 06/30/2024, 8:54 PM [facility] Pager: [phone] Isaiah 41:10   If 7PM-7AM, please contact night-coverage [email or web address] Password [email or web address]

## 2024-06-30 NOTE — ED Triage Notes (Signed)
 Patient arrives POV from home after a migraine woke her up at 2:30am today; patient states she went downhill very quickly after that. Patient reports taking Nurtec at  2:45, then epi-pen at 3:15 am; patient has an alpha-gal allergy; states she suspects her take-out was cross-contaminated. Patient had multiple syncopal episodes on the way to ED; then 2-3 times on arrival to ED. Patient states nausea and migraine are her typical rxn to alpha-gal exposure; husband states syncopal episodes are new. ED provider Dr. Carita assessing patient during triage.

## 2024-06-30 NOTE — ED Notes (Signed)
 [facility] at bedside

## 2024-06-30 NOTE — Plan of Care (Addendum)
 [facility] emergency department to [facility] progressive unit transfer:  58 year old female history of migraine and alpha gal allergy presented to emergency department at [facility] by patient husband complaining of significant headache. Patient was in usual state of health however at 2:30 AM wake up due to significant headache did not relief after taking Nurtec at home.  Patient thought she has headache from alpha-gal exacerbation so she take EpiPen  at 3:15 AM still continues to have headache. Patient's husband reported that on the way to emergency department patient has multiple episodes of syncope requiring stimulation to stay awake. Patient denies any shortness of breath, focal weakness, stridor, lip swelling, tongue swelling, dysphagia dysarthria. Patient denies any weakness however stating she cannot to stand and cannot walk due to weakness. ffuse.  She is never having difficulty staying awake before syncopal episodes.  At presentation to ED patient found hypertensive otherwise hemodynamically stable. Lab work, CMP low potassium 3.1 otherwise unremarkable.  CBC unremarkable.  Normal APTT INR.  UDS and blood alcohol level  CT head no acute intracranial abnormality. CT angio head and neck no evidence of large vessel occlusion however it showed segmental vasospasm  of a Left MCA M3 branch. Other circle-of-Willis branches appear within normal limits. In the appropriate clinical setting RCVS could be considered.  ED physician consulted neurology differential is severe migraine with atypical features versus medication over use increased headache versus allergic reaction, possible allergic reaction and syncope.  Extensive recommendation on discharge please review for detailed information. Complex Migraine v Medication Overuse Headache v Allergic Reaction     Administer IV medications:   Magnesium  2 grams   Compazine  10 mg   Benadryl  25 mg   Tylenol  1 gram   Decadron    Pepcid    Administer  500cc IV fluids   Dim lights to reduce photophobia   - If the headache does not resolve after the above, then provide 500 mg IV divalproex sodium once.   - If the headache does not resolve after the above, then provide 100mg  IV lacosamide once.   - If his headache can not be controlled to a tolerable range with the above, admit for MRI brain with and without contrast.    Recommend Mg glycinate 400mg  Qbed and Riboflavin (B2) 400mg  Qbed for migraine ppx.  For abortive therapy, Naproxen  <3x weekly to avoid rebound/medication overuse phenomena.     - Consider referral to a neurologist for further evaluation and management of migraines, including preventive medication options: SSRI, TC, CGRP inhibitors     Monitor for symptom improvement and vital sign stabilization   Reassess neurological status periodically   Educate patient on the importance of preventive measures for migraines    Possible Allergic Reaction:   Continue monitoring for signs of allergic reaction   Administered Benadryl  25 mg IV as part of the treatment regimen   Consider allergy/immunology consultation if symptoms persist or recur    Syncope:   Monitor vital signs closely   Maintain IV access and continue fluid administration   Consider additional cardiac workup if syncope recurs or if other concerning symptoms develop   Our recommendations are outlined below.   Recommendations:        IV Fluids, Normal Saline       Head of Bed 30 Degrees       Euglycemia and Avoid Hyperthermia (PRN Acetaminophen )   -Dr. Carita reported that patient is still having right-sided facial droop and patient stating that whenever she gets migraine headache she always develop right-sided  facial droop.  Dr. Carita also spoke with our on-call neurology Dr. Candie recommended for MRI to rule out stroke as well.  Also while patient in the ED has 3 episodes of syncope.  -Requested Dr. Carita to transfer patient to ED to ED as it is going to  take much longer time to get a bed.  As of now I have requested bed to progressive unit.  Once patient will be transferred to ED to ED MRI can be done immediately.  -In the ED patient received Decadron  total 2 doses, famotidine , 2 L of LR bolus, mag sulfate, Benadryl .  Hospitalist has been consulted for further evaluation management of recurrent syncope, alpha gal allergy reaction, complex migraine headache and need to rule out a stroke.

## 2024-06-30 NOTE — ED Notes (Signed)
 Called Taryn at [facility] for transport

## 2024-06-30 NOTE — Progress Notes (Signed)
 New Admission Note:   Arrival Method: Stretcher  Mental Orientation: Alert and Oriented  Telemetry: Assessment: Completed Skin: Intact  IV: 20 G RAC and 20 C LAC Pain: 0/10  Tubes: none  Safety Measures: Safety Fall Prevention Plan has been given, discussed and signed Admission: Completed Orientation: Patient has been orientated to the room, unit and staff.  Family: Husband   Orders have been reviewed and implemented. Will continue to monitor the patient. Call light has been placed within reach and bed alarm has been activated.   Erie Browner RN [facility]  Phone: [phone]

## 2024-06-30 NOTE — ED Notes (Addendum)
Code Stroke cancelled.  

## 2024-07-01 ENCOUNTER — Observation Stay (HOSPITAL_BASED_OUTPATIENT_CLINIC_OR_DEPARTMENT_OTHER)

## 2024-07-01 DIAGNOSIS — G43101 Migraine with aura, not intractable, with status migrainosus: Secondary | ICD-10-CM | POA: Diagnosis present

## 2024-07-01 DIAGNOSIS — G43109 Migraine with aura, not intractable, without status migrainosus: Secondary | ICD-10-CM | POA: Diagnosis not present

## 2024-07-01 DIAGNOSIS — T7840XA Allergy, unspecified, initial encounter: Secondary | ICD-10-CM

## 2024-07-01 DIAGNOSIS — R55 Syncope and collapse: Secondary | ICD-10-CM | POA: Diagnosis not present

## 2024-07-01 DIAGNOSIS — G43919 Migraine, unspecified, intractable, without status migrainosus: Secondary | ICD-10-CM | POA: Insufficient documentation

## 2024-07-01 DIAGNOSIS — E876 Hypokalemia: Secondary | ICD-10-CM

## 2024-07-01 LAB — BASIC METABOLIC PANEL WITH GFR
Anion gap: 8 (ref 5–15)
BUN: 20 mg/dL (ref 6–20)
CO2: 22 mmol/L (ref 22–32)
Calcium: 9 mg/dL (ref 8.9–10.3)
Chloride: 108 mmol/L (ref 98–111)
Creatinine, Ser: 0.92 mg/dL (ref 0.44–1.00)
GFR, Estimated: >60 mL/min (ref >60)
Glucose, Bld: 103 mg/dL — ABNORMAL HIGH (ref 70–99)
Potassium: 3.9 mmol/L (ref 3.5–5.1)
Sodium: 138 mmol/L (ref 135–145)

## 2024-07-01 LAB — CBC
HCT: 37.4 % (ref 36.0–46.0)
Hemoglobin: 12.7 g/dL (ref 12.0–15.0)
MCH: 32.7 pg (ref 26.0–34.0)
MCHC: 34 g/dL (ref 30.0–36.0)
MCV: 96.4 fL (ref 80.0–100.0)
Platelets: 216 K/uL (ref 150–400)
RBC: 3.88 MIL/uL (ref 3.87–5.11)
RDW: 11.9 % (ref 11.5–15.5)
WBC: 9.3 K/uL (ref 4.0–10.5)
nRBC: 0 % (ref 0.0–0.2)

## 2024-07-01 LAB — ECHOCARDIOGRAM COMPLETE
AR max vel: 3.87 cm2
AV Peak grad: 5.5 mmHg
Ao pk vel: 1.17 m/s
Area-P 1/2: 3.91 cm2
S' Lateral: 1.7 cm

## 2024-07-01 LAB — MAGNESIUM: Magnesium: 2.2 mg/dL (ref 1.7–2.4)

## 2024-07-01 LAB — HIV ANTIBODY (ROUTINE TESTING W REFLEX): HIV Screen 4th Generation wRfx: NONREACTIVE

## 2024-07-01 MED ORDER — DIPHENHYDRAMINE HCL 50 MG/ML IJ SOLN: 25.0000 mg | Freq: Four times a day (QID) | INTRAMUSCULAR | Status: DC | PRN

## 2024-07-01 NOTE — Progress Notes (Signed)
 Echocardiogram 2D Echocardiogram has been performed.  Nicole Lynch 07/01/2024, 12:42 PM

## 2024-07-01 NOTE — Plan of Care (Signed)

## 2024-07-01 NOTE — Discharge Summary (Signed)
 Physician Discharge Summary  Nicole Lynch FMW:[id number] DOB: 25-Jul-1966 DOA: 06/30/2024  PCP: Domenica Harlene LABOR, MD  Admit date: 06/30/2024 Discharge date: 07/01/2024  Admitted From: Home Disposition:  Home  Recommendations for Outpatient Follow-up:  Follow up with PCP in 1-2 weeks Follow-up on 2D echo  Home Health:No Equipment/Devices:None  Discharge Condition:Stable CODE STATUS:Full Diet recommendation: Heart Healthy  Brief/Interim Summary:  58 y.o. female past medical history significant for migraine headaches, anxiety and depression comes in to [facility] for recurrent syncope, patient would like mother passed away 4 days prior to admission.  A day after she woke up with a headache and nausea with vomiting.  Per the husband on the way to the ER she had 2 syncopal episodes and 2 additional ones in the ED.  There were no prodromal symptoms.  CT head was negative CT angio of the head and neck showed no large LVO, however it did show questionable vasospasm of the left MCA M3 branch, MRI of the brain showed no acute intracranial abnormalities.   Discharge Diagnoses:  Principal Problem:   Recurrent syncope Active Problems:   Hypokalemia   Allergic reaction   Complicated migraine, intractable  Complicated intractable migraine headaches/recurrent syncope: She had a cluster of multiple syncopal episodes within a 2-hour period. Without any postictal state or loss control sphincters. MRI of the brain showed no acute CVA. CTA of the head showed no large LVO but it did show vasospasm at M3 branch. She was given magnesium  Compazine  Benadryl  and Decadron  and the migraine headache subsided. This morning she had no headaches yesterday she was able to tolerate her diet. 2D echo was done which is pending and will follow-up PCP.  Hypokalemia: Likely due to vomiting repleted now improved.  Anxiety/depression: Noted follow-up PCP as an outpatient. Support was offered.  History of Alphagan  allergies: She did use to use EpiPen  for this which are told her it can cause vasoconstriction.  May have exacerbated and worsened the event.   Discharge Instructions  Discharge Instructions     Diet - low sodium heart healthy   Complete by: As directed    Increase activity slowly   Complete by: As directed       Allergies as of 07/01/2024       Reactions   Alpha-gal    Migraine, nausea   Latex Itching, Rash   Bandaids        Medication List     TAKE these medications    albuterol  (2.5 MG/3ML) 0.083% nebulizer solution Commonly known as: PROVENTIL  Take 2.5 mg by nebulization every 6 (six) hours as needed for wheezing.   aspirin-acetaminophen -caffeine 250-250-65 MG tablet Commonly known as: EXCEDRIN MIGRAINE Take 1 tablet by mouth every 6 (six) hours as needed for migraine.   EPINEPHrine  0.3 mg/0.3 mL Soaj injection Commonly known as: EPI-PEN Inject 0.3 mg into the muscle as needed for anaphylaxis.   NEURIVA PO Take 1 tablet by mouth daily.   Nurtec 75 MG Tbdp Generic drug: Rimegepant Sulfate TAKE 1 TABLET BY MOUTH AT THE EARLIEST ONSET OF MIGRAINE. MAX 1 TAB/24 HOURS.   ondansetron  4 MG disintegrating tablet Commonly known as: Zofran  ODT Take 1 tablet (4 mg total) by mouth every 8 (eight) hours as needed for nausea or vomiting.   triamterene-hydrochlorothiazide 37.5-25 MG capsule Commonly known as: DYAZIDE Take 1 capsule by mouth once a week.   UNABLE TO FIND Med Name: CBD for Knee Pain        Allergies  Allergen Reactions  Alpha-Gal     Migraine, nausea   Latex Itching and Rash    Bandaids    Consultations: Neurology  Procedures/Studies: MR Brain W and Wo Contrast Result Date: 06/30/2024 CLINICAL DATA:  Provided history: Neuro deficit, acute, stroke suspected. EXAM: MRI HEAD WITHOUT AND WITH CONTRAST TECHNIQUE: Multiplanar, multiecho pulse sequences of the brain and surrounding structures were obtained without and with intravenous  contrast. CONTRAST:  6mL GADAVIST  GADOBUTROL  1 MMOL/ML IV SOLN COMPARISON:  Non-contrast head CT and CT angiogram head/neck 06/30/2024. FINDINGS: Brain: Cerebral volume is normal. Tiny nonspecific focus of T2 FLAIR hyperintense signal abnormality within the left parietal white matter (series 11, image 31). No cortical encephalomalacia is identified. There is no acute infarct. No evidence of an intracranial mass. No chronic intracranial blood products. No extra-axial fluid collection. No midline shift. No pathologic intracranial enhancement identified. Vascular: Maintained flow voids within the proximal large arterial vessels. Skull and upper cervical spine: No focal worrisome marrow lesion. Sinuses/Orbits: No mass or acute finding within the imaged orbits. 10 mm mucous retention cyst within the right maxillary sinus. IMPRESSION: 1.  No evidence of an acute intracranial abnormality. 2. Tiny nonspecific chronic insult within the left parietal white matter. 3. Otherwise unremarkable MRI appearance of the brain. 4. 10 mm right maxillary sinus mucous retention cyst. Electronically Signed   By: Rockey Childs D.O.   On: 06/30/2024 09:07   CT HEAD CODE STROKE WO CONTRAST Addendum Date: 06/30/2024 ADDENDUM REPORT: 06/30/2024 04:56 ADDENDUM: Study discussed by telephone with Dr. GARNETTE HALO on 06/30/2024 at 04:55 . Electronically Signed   By: VEAR Hurst M.D.   On: 06/30/2024 04:56   Result Date: 06/30/2024 CLINICAL DATA:  Code stroke.  58 year old female. EXAM: CT HEAD WITHOUT CONTRAST TECHNIQUE: Contiguous axial images were obtained from the base of the skull through the vertex without intravenous contrast. RADIATION DOSE REDUCTION: This exam was performed according to the departmental dose-optimization program which includes automated exposure control, adjustment of the mA and/or kV according to patient size and/or use of iterative reconstruction technique. COMPARISON:  Head CT 11/14/2015. FINDINGS: Brain: Normal  cerebral volume for age. No midline shift, ventriculomegaly, mass effect, evidence of mass lesion, intracranial hemorrhage or evidence of cortically based acute infarction. Gray-white matter differentiation is within normal limits throughout the brain. Vascular: Faint Calcified atherosclerosis at the skull base. No suspicious intracranial vascular hyperdensity. Skull: Intact. Sinuses/Orbits: Chronic mild maxillary sinus mucosal thickening and retention cysts. Other Visualized paranasal sinuses and mastoids are stable and well aerated. Other: No gaze deviation. No acute orbit or scalp soft tissue finding. ASPECTS [facility] Stroke Program Early CT Score) Total score (0-10 with 10 being normal): 10 IMPRESSION: Normal for age noncontrast CT appearance of the brain.  ASPECTS 10. Electronically Signed: By: VEAR Hurst M.D. On: 06/30/2024 04:44   CT ANGIO HEAD NECK W WO CM (CODE STROKE) Result Date: 06/30/2024 CLINICAL DATA:  Code stroke. 58 year old female. Dr. GARNETTE HALO reports presentation is severe headache and LEFT facial droop. EXAM: CT ANGIOGRAPHY HEAD AND NECK TECHNIQUE: Multidetector CT imaging of the head and neck was performed using the standard protocol during bolus administration of intravenous contrast. Multiplanar CT image reconstructions and MIPs were obtained to evaluate the vascular anatomy. Carotid stenosis measurements (when applicable) are obtained utilizing NASCET criteria, using the distal internal carotid diameter as the denominator. RADIATION DOSE REDUCTION: This exam was performed according to the departmental dose-optimization program which includes automated exposure control, adjustment of the mA and/or kV according to patient size and/or  use of iterative reconstruction technique. CONTRAST:  75mL OMNIPAQUE  IOHEXOL  350 MG/ML SOLN COMPARISON:  Head CT today. FINDINGS: CTA NECK Skeleton: Mandible motion artifact. Mild for age cervical spine degeneration. No acute osseous abnormality identified.  Upper chest: Visible subglottic trachea is normal, upper lungs well aerated. Negative visible superior mediastinum. Other neck: Heterogeneous right thyroid  lobe nodule measures 16 mm diameter. Mild motion artifact in the neck. Other nonvascular neck soft tissue spaces appear negative. Aortic arch: Mostly visible 3 vessel arch.  No arch atherosclerosis. Right carotid system: Patent with mild tortuosity, no significant plaque or stenosis. Left carotid system: Patent with no significant plaque or stenosis. Mild tortuosity. Vertebral arteries: Early origin of the right vertebral from the proximal subclavian. Dominant and tortuous right vertebral with a late entry into the cervical transverse foramen (series 7, image 226). No right vertebral artery plaque or stenosis to the skull base. Proximal left subclavian artery and left vertebral artery origin are normal. Non dominant left vertebral artery with a fairly normal caliber is patent to the skull base with mild tortuosity, no significant plaque or stenosis. CTA HEAD Posterior circulation: Diminutive distal left vertebral V4 segment but remains patent to the basilar. Dominant right vertebral V4 segment primarily supplies the basilar with no significant plaque or stenosis. Normal right PICA origin. Left AICA appears dominant. Patent basilar artery without stenosis. Normal SCA and PCA origins. Fetal type right PCA origin. Left posterior communicating artery diminutive or absent. Bilateral PCA branches are within normal limits. Anterior circulation: Both ICA siphons are patent, mildly ectatic, with no atherosclerosis or stenosis. Normal right posterior communicating artery origin. Patent carotid termini, MCA and ACA origins. Dominant left ACA A1 segment and mildly ectatic anterior communicating artery. Diminutive right A1. No saccular aneurysm. Bilateral ACA branches are tortuous but otherwise within normal limits. Left MCA M1 segment and bifurcation are patent without  stenosis. Right MCA M1 segment bifurcates early without stenosis. Left MCA branches appear patent on series 12, image 30, although with questionable segmental vaso constriction of an M3 branch (arrow). No other convincing MCA or intracranial vaso spasm bilaterally. Venous sinuses: Early contrast timing, superior sagittal sinus appears patent. Anatomic variants: Dominant right vertebral artery with both an early origin from the subclavian and late entry into the cervical transverse foramen. Dominant left and diminutive right ACA A1 segments. Review of the MIP images confirms the above findings IMPRESSION: 1. Negative for large vessel occlusion. Generalized vessel tortuosity but no aneurysm identified. No atherosclerosis. 2. Questionable segmental vasospasm of a Left MCA M3 branch. Other circle-of-Willis branches appear within normal limits. In the appropriate clinical setting RCVS could be considered. 3. Heterogeneous 16 mm right thyroid  lobe nodule meets size criteria for non emergent follow-up thyroid  ultrasound. #1 and #2 were discussed by telephone with Dr. GARNETTE HALO on 06/30/2024 at 04:55 . Electronically Signed   By: VEAR Hurst M.D.   On: 06/30/2024 04:56   (Echo, Carotid, EGD, Colonoscopy, ERCP)    Subjective: No complaints  Discharge Exam: Vitals:   06/30/24 2335 07/01/24 0417  BP: 131/75 133/86  Pulse: 72 67  Resp: 18 17  Temp: 97.6 F (36.4 C) 97.8 F (36.6 C)  SpO2: 99% 97%   Vitals:   06/30/24 1824 06/30/24 2100 06/30/24 2335 07/01/24 0417  BP: (!) 155/82  131/75 133/86  Pulse: 75 67 72 67  Resp: 15  18 17   Temp: 98.5 F (36.9 C) 98.2 F (36.8 C) 97.6 F (36.4 C) 97.8 F (36.6 C)  TempSrc: Oral Oral  Oral Oral  SpO2: 97% 95% 99% 97%    General: Pt is alert, awake, not in acute distress Cardiovascular: RRR, S1/S2 +, no rubs, no gallops Respiratory: CTA bilaterally, no wheezing, no rhonchi Abdominal: Soft, NT, ND, bowel sounds + Extremities: no edema, no  cyanosis    The results of significant diagnostics from this hospitalization (including imaging, microbiology, ancillary and laboratory) are listed below for reference.     Microbiology: No results found for this or any previous visit (from the past 240 hours).   Labs: BNP (last 3 results) No results for input(s): BNP in the last 8760 hours. Basic Metabolic Panel: Recent Labs  Lab 06/30/24 0419 07/01/24 0141  NA 139 138  K 3.1* 3.9  CL 100 108  CO2 21* 22  GLUCOSE 126* 103*  BUN 15 20  CREATININE 0.97 0.92  CALCIUM 10.2 9.0  MG  --  2.2   Liver Function Tests: Recent Labs  Lab 06/30/24 0419  AST 20  ALT 12  ALKPHOS 80  BILITOT 0.8  PROT 7.5  ALBUMIN 4.8   No results for input(s): LIPASE, AMYLASE in the last 168 hours. No results for input(s): AMMONIA in the last 168 hours. CBC: Recent Labs  Lab 06/30/24 0419 07/01/24 0141  WBC 6.7 9.3  NEUTROABS 3.5  --   HGB 14.8 12.7  HCT 42.8 37.4  MCV 94.7 96.4  PLT 246 216   Cardiac Enzymes: No results for input(s): CKTOTAL, CKMB, CKMBINDEX, TROPONINI in the last 168 hours. BNP: Invalid input(s): POCBNP CBG: Recent Labs  Lab 06/30/24 0418  GLUCAP 127*   D-Dimer No results for input(s): DDIMER in the last 72 hours. Hgb A1c No results for input(s): HGBA1C in the last 72 hours. Lipid Profile No results for input(s): CHOL, HDL, LDLCALC, TRIG, CHOLHDL, LDLDIRECT in the last 72 hours. Thyroid  function studies No results for input(s): TSH, T4TOTAL, T3FREE, THYROIDAB in the last 72 hours.  Invalid input(s): FREET3 Anemia work up No results for input(s): VITAMINB12, FOLATE, FERRITIN, TIBC, IRON, RETICCTPCT in the last 72 hours. Urinalysis    Component Value Date/Time   COLORURINE YELLOW 04/22/2021 1434   APPEARANCEUR Cloudy (A) 04/22/2021 1434   LABSPEC 1.025 04/22/2021 1434   PHURINE 6.0 04/22/2021 1434   GLUCOSEU NEGATIVE 04/22/2021 1434   HGBUR  MODERATE (A) 04/22/2021 1434   BILIRUBINUR NEGATIVE 04/22/2021 1434   KETONESUR TRACE (A) 04/22/2021 1434   PROTEINUR NEGATIVE 02/11/2021 0006   UROBILINOGEN 0.2 04/22/2021 1434   NITRITE NEGATIVE 04/22/2021 1434   LEUKOCYTESUR NEGATIVE 04/22/2021 1434   Sepsis Labs Recent Labs  Lab 06/30/24 0419 07/01/24 0141  WBC 6.7 9.3   Microbiology No results found for this or any previous visit (from the past 240 hours).   Time coordinating discharge: Over 35 minutes  SIGNED:   Erle Odell Castor, MD  [facility] 07/01/2024, 6:33 AM Pager   If 7PM-7AM, please contact night-coverage [email or web address] Password [email or web address]

## 2024-07-01 NOTE — Progress Notes (Signed)
 PIV removed. AVS reviewed. Patient verbalized understanding of necessary follow up appointments.

## 2024-07-03 ENCOUNTER — Telehealth: Payer: Self-pay

## 2024-07-03 NOTE — Transitions of Care (Post Inpatient/ED Visit) (Signed)
 07/03/2024  Name: Nicole Lynch MRN: [id number] DOB: 1965/12/31  Today's TOC FU Call Status: Today's TOC FU Call Status:: Successful TOC FU Call Completed TOC FU Call Complete Date: 07/03/24 Patient's Name and Date of Birth confirmed.  Transition Care Management Follow-up Telephone Call Date of Discharge: 07/01/24 Discharge Facility: [facility]) Type of Discharge: Inpatient Admission Primary Inpatient Discharge Diagnosis:: syncope How have you been since you were released from the hospital?: Better Any questions or concerns?: No  Items Reviewed: Did you receive and understand the discharge instructions provided?: Yes Medications obtained,verified, and reconciled?: Yes (Medications Reviewed) Any new allergies since your discharge?: No Dietary orders reviewed?: Yes Do you have support at home?: Yes People in Home [RPT]: spouse  Medications Reviewed Today: Medications Reviewed Today     Reviewed by Emmitt Pan, LPN (Licensed Practical Nurse) on 07/03/24 at 1532  Med List Status: <None>   Medication Order Taking? Sig Documenting Provider Last Dose Status Informant  albuterol  (PROVENTIL ) (2.5 MG/3ML) 0.083% nebulizer solution [id number] Yes Take 2.5 mg by nebulization every 6 (six) hours as needed for wheezing. [provider]  Active Self, Pharmacy Records           Med Note Lynch, Nicole   Fri Jun 30, 2024  9:12 PM) PRN, no recent need/use  aspirin-acetaminophen -caffeine (EXCEDRIN MIGRAINE) 250-250-65 MG tablet [id number] Yes Take 1 tablet by mouth every 6 (six) hours as needed for migraine. [provider]  Active Self, Pharmacy Records  EPINEPHrine  0.3 mg/0.3 mL IJ SOAJ injection [id number] Yes Inject 0.3 mg into the muscle as needed for anaphylaxis. Almarie Waddell NOVAK, NP  Active Self, Pharmacy Records  Misc Natural Products (NEURIVA PO) [id number] Yes Take 1 tablet by mouth daily. [provider]  Active Self, Pharmacy Records  ondansetron   (ZOFRAN  ODT) 4 MG disintegrating tablet [id number] Yes Take 1 tablet (4 mg total) by mouth every 8 (eight) hours as needed for nausea or vomiting. Skeet Juliene SAUNDERS, DO  Active Self, Pharmacy Records           Med Note [facility], Nicole   Fri Jun 30, 2024  9:13 PM) PRN, no recent need/use  Rimegepant Sulfate (NURTEC) 75 MG TBDP [id number] Yes TAKE 1 TABLET BY MOUTH AT THE EARLIEST ONSET OF MIGRAINE. MAX 1 TAB/24 HOURS. Nicole Harlene LABOR, MD  Active Self, Pharmacy Records  triamterene-hydrochlorothiazide [facility]) 37.5-25 MG capsule [id number] Yes Take 1 capsule by mouth once a week. [provider]  Active Self, Pharmacy Records  UNABLE TO LARA [id number] Yes Med Name: CBD for Knee Pain [provider]  Active Self, Pharmacy Records            Home Care and Equipment/Supplies: Were Home Health Services Ordered?: NA Any new equipment or medical supplies ordered?: NA  Functional Questionnaire: Do you need assistance with bathing/showering or dressing?: No Do you need assistance with meal preparation?: No Do you need assistance with eating?: No Do you have difficulty maintaining continence: No Do you need assistance with getting out of bed/getting out of a chair/moving?: No Do you have difficulty managing or taking your medications?: No  Follow up appointments reviewed: PCP Follow-up appointment confirmed?: No (declined) MD Provider Line Number:[phone] Given: No Specialist Hospital Follow-up appointment confirmed?: No Reason Specialist Follow-Up Not Confirmed: Patient has Specialist Provider Number and will Call for Appointment Do you need transportation to your follow-up appointment?: No Do you understand care options if your condition(s) worsen?: Yes-patient verbalized understanding    SIGNATURE Pan Emmitt, LPN  [facility] Nurse Health Advisor Direct Dial [phone]

## 2025-03-20 ENCOUNTER — Ambulatory Visit: Admitting: Allergy
